# Patient Record
Sex: Female | Born: 1995
Health system: Southern US, Community
[De-identification: ages and names within clinical notes are randomized; demographics above are authoritative.]

## PROBLEM LIST (undated history)

## (undated) DIAGNOSIS — R600 Localized edema: Secondary | ICD-10-CM

## (undated) DIAGNOSIS — F329 Major depressive disorder, single episode, unspecified: Secondary | ICD-10-CM

## (undated) DIAGNOSIS — E282 Polycystic ovarian syndrome: Secondary | ICD-10-CM

## (undated) DIAGNOSIS — R7303 Prediabetes: Secondary | ICD-10-CM

## (undated) DIAGNOSIS — K760 Fatty (change of) liver, not elsewhere classified: Secondary | ICD-10-CM

## (undated) DIAGNOSIS — K219 Gastro-esophageal reflux disease without esophagitis: Secondary | ICD-10-CM

## (undated) DIAGNOSIS — G4733 Obstructive sleep apnea (adult) (pediatric): Secondary | ICD-10-CM

## (undated) DIAGNOSIS — G8929 Other chronic pain: Secondary | ICD-10-CM

## (undated) DIAGNOSIS — M549 Dorsalgia, unspecified: Secondary | ICD-10-CM

## (undated) DIAGNOSIS — K5900 Constipation, unspecified: Secondary | ICD-10-CM

## (undated) DIAGNOSIS — E538 Deficiency of other specified B group vitamins: Secondary | ICD-10-CM

## (undated) DIAGNOSIS — M2550 Pain in unspecified joint: Secondary | ICD-10-CM

## (undated) DIAGNOSIS — E7800 Pure hypercholesterolemia, unspecified: Secondary | ICD-10-CM

## (undated) DIAGNOSIS — R002 Palpitations: Secondary | ICD-10-CM

## (undated) DIAGNOSIS — F411 Generalized anxiety disorder: Secondary | ICD-10-CM

## (undated) DIAGNOSIS — F419 Anxiety disorder, unspecified: Secondary | ICD-10-CM

## (undated) DIAGNOSIS — F32A Depression, unspecified: Secondary | ICD-10-CM

## (undated) HISTORY — DX: Constipation, unspecified: K59.00

## (undated) HISTORY — DX: Gastro-esophageal reflux disease without esophagitis: K21.9

## (undated) HISTORY — DX: Deficiency of other specified B group vitamins: E53.8

## (undated) HISTORY — DX: Pure hypercholesterolemia, unspecified: E78.00

## (undated) HISTORY — DX: Fatty (change of) liver, not elsewhere classified: K76.0

## (undated) HISTORY — DX: Localized edema: R60.0

## (undated) HISTORY — DX: Prediabetes: R73.03

## (undated) HISTORY — DX: Dorsalgia, unspecified: M54.9

## (undated) HISTORY — DX: Generalized anxiety disorder: F41.1

## (undated) HISTORY — DX: Pain in unspecified joint: M25.50

## (undated) HISTORY — DX: Obstructive sleep apnea (adult) (pediatric): G47.33

## (undated) HISTORY — DX: Palpitations: R00.2

---
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
## 2016-09-04 ENCOUNTER — Encounter (HOSPITAL_BASED_OUTPATIENT_CLINIC_OR_DEPARTMENT_OTHER): Payer: Self-pay | Admitting: *Deleted

## 2016-09-04 DIAGNOSIS — F17210 Nicotine dependence, cigarettes, uncomplicated: Secondary | ICD-10-CM | POA: Insufficient documentation

## 2016-09-04 DIAGNOSIS — R51 Headache: Secondary | ICD-10-CM | POA: Insufficient documentation

## 2016-09-04 DIAGNOSIS — H5712 Ocular pain, left eye: Secondary | ICD-10-CM | POA: Insufficient documentation

## 2016-09-04 MED ORDER — FLUORESCEIN SODIUM 0.6 MG OP STRP
1.0000 | ORAL_STRIP | Freq: Once | OPHTHALMIC | Status: AC
  Administered 2016-09-05: 1 via OPHTHALMIC
  Filled 2016-09-04: qty 1

## 2016-09-04 MED ORDER — TETRACAINE HCL 0.5 % OP SOLN
1.0000 [drp] | Freq: Once | OPHTHALMIC | Status: AC
  Administered 2016-09-05: 1 [drp] via OPHTHALMIC
  Filled 2016-09-04: qty 4

## 2016-09-04 NOTE — ED Triage Notes (Signed)
Pt c/o left eye redness ? Foreign body x 1 hr

## 2016-09-05 ENCOUNTER — Emergency Department (HOSPITAL_BASED_OUTPATIENT_CLINIC_OR_DEPARTMENT_OTHER)
Admission: EM | Admit: 2016-09-05 | Discharge: 2016-09-05 | Disposition: A | Discharge status: Home / Self Care | Payer: Self-pay | Attending: Emergency Medicine | Admitting: Emergency Medicine

## 2016-09-05 DIAGNOSIS — H5712 Ocular pain, left eye: Secondary | ICD-10-CM

## 2016-09-05 MED ORDER — IBUPROFEN 400 MG PO TABS
400.0000 mg | ORAL_TABLET | Freq: Once | ORAL | Status: AC
  Administered 2016-09-05: 400 mg via ORAL
  Filled 2016-09-05: qty 1

## 2016-09-05 MED ORDER — OXYCODONE-ACETAMINOPHEN 5-325 MG PO TABS: 1.0000 | ORAL_TABLET | Freq: Once | ORAL | Status: DC

## 2016-09-05 MED ORDER — IBUPROFEN 400 MG PO TABS
ORAL_TABLET | ORAL | Status: AC
  Filled 2016-09-05: qty 1

## 2016-09-05 MED ORDER — OXYCODONE-ACETAMINOPHEN 5-325 MG PO TABS
ORAL_TABLET | ORAL | Status: AC
  Filled 2016-09-05: qty 1

## 2016-09-05 NOTE — Discharge Instructions (Signed)
You were seen in the emergency department for left eye pain. The pH of your eye was normal, there was no scratches or ulcers, no changes in your visual acuity and normal pressure. You may alternate Tylenol 1000 g every 6 hours as needed for pain and ibuprofen 800 mg every 8 hours as needed for pain. Your eyes did seem very dry. You may use over-the-counter eyedrops you help with lubrication. Given your symptoms have improved, I feel you're safe to be discharged home. He may follow-up with an ophthalmologist if symptoms are not improving or worsen. If you ever have fever, loss of vision, drainage of pus appearing material from your eye, severe headache with vomiting with eye pain, please return to the hospital.

## 2016-09-05 NOTE — ED Notes (Signed)
EDP into room 

## 2016-09-05 NOTE — ED Provider Notes (Signed)
By signing my name below, I, Vista Mink, attest that this documentation has been prepared under the direction and in the presence of Ericson Nafziger N Daylan Juhnke, DO. Electronically signed, Vista Mink, ED Scribe. 09/05/16. 2:25 AM.  TIME SEEN: 2:17 AM  CHIEF COMPLAINT: Left eye pain and redness  HPI:  HPI Comments: Molly Ray is a 21 y.o. female who presents to the Emergency Department complaining of left eye pain and redness after an incident that occurred at approximately 2130 yesterday, 6 hours ago. Pt was in a rush and was trying to put on Mitchum gel deodorant but reports it accidentally flew into her left eye. She does report a mild headache since this occurred but no nausea or vomiting. No change in vision currently. Did have some blurry vision that has resolved. No vision loss.. No other injuries to eye or face. No eye surgeries. She does not wear contacts or glasses. She does not have a current Opthalmologist. She does not use eye drops regularly.    ROS: See HPI Constitutional: no fever  Eyes: no drainage  ENT: no runny nose   Cardiovascular:  no chest pain  Resp: no SOB  GI: no vomiting GU: no dysuria Integumentary: no rash  Allergy: no hives  Musculoskeletal: no leg swelling  Neurological: no slurred speech ROS otherwise negative  PAST MEDICAL HISTORY/PAST SURGICAL HISTORY:  History reviewed. No pertinent past medical history.  MEDICATIONS:  Prior to Admission medications   Not on File    ALLERGIES:  No Known Allergies  SOCIAL HISTORY:  Social History  Substance Use Topics  . Smoking status: Current Every Day Smoker    Packs/day: 1.00    Types: Cigarettes  . Smokeless tobacco: Not on file  . Alcohol use No    FAMILY HISTORY: No family history on file.  EXAM: BP 129/89   Pulse 96   Temp 98 F (36.7 C)   Resp 18   Ht 5\' 4"  (1.626 m)   Wt 210 lb (95.3 kg)   SpO2 100%   BMI 36.05 kg/m  CONSTITUTIONAL: Alert and oriented and responds appropriately to  questions. Well-appearing; well-nourished HEAD: Normocephalic, Atraumatic EYES: Conjunctivae clear, no discharge, PERRL, EOMI. Normal visual fields and visual acuity. IOP is in left eye. No corneal abrasion or ulceration to left eye. The pH of the left eye is 7.0 - 7.5.  No sign of any burns to the cornea. No obvious foreign body. ENT: normal nose; no rhinorrhea; moist mucous membranes, no facial cellulitis or swelling NECK: Supple, no meningismus, no nuchal rigidity, no LAD  CARD: RRR; S1 and S2 appreciated; no murmurs, no clicks, no rubs, no gallops RESP: Normal chest excursion without splinting or tachypnea; breath sounds clear and equal bilaterally; no wheezes, no rhonchi, no rales, no hypoxia or respiratory distress, speaking full sentences ABD/GI: Normal bowel sounds; non-distended; soft, non-tender BACK:  The back appears normal EXT: Normal ROM in all joints    SKIN: Normal color for age and race; warm; no rash NEURO: Moves all extremities equally PSYCH: The patient's mood and manner are appropriate. Grooming and personal hygiene are appropriate.  MEDICAL DECISION MAKING: Patient here with left eye pain and blurry vision that has improved after getting deodorant into her eye. No corneal abrasions or ulceration. Globe is intact. Normal visual fields and visual acuity. Extraocular movements are normal. Normal pressure of the left eye. Normal pH. No sign of any burns. No sign of facial cellulitis. I do not feel her eye needs  to be irrigated at this time. I do not feel she needs antibiotic ointment or drops. She reports feeling better. Have given her outpatient ophthalmology follow-up if symptoms are not improving.  Suspect that this was a mild irritant in symptoms are improving with time. She has been using over-the-counter eye lubrication which has been helping her symptoms. Discussed with her that she could continue this.  At this time, I do not feel there is any life-threatening  condition present. I have reviewed and discussed all results (EKG, imaging, lab, urine as appropriate) and exam findings with patient/family. I have reviewed nursing notes and appropriate previous records.  I feel the patient is safe to be discharged home without further emergent workup and can continue workup as an outpatient as needed. Discussed usual and customary return precautions. Patient/family verbalize understanding and are comfortable with this plan.  Outpatient follow-up has been provided. All questions have been answered.   I personally performed the services described in this documentation, which was scribed in my presence. The recorded information has been reviewed and is accurate.     Layla MawKristen N Shanen Norris, DO 09/05/16 [phone]

## 2018-01-04 ENCOUNTER — Encounter (HOSPITAL_BASED_OUTPATIENT_CLINIC_OR_DEPARTMENT_OTHER): Payer: Self-pay | Admitting: Adult Health

## 2018-01-04 ENCOUNTER — Other Ambulatory Visit: Payer: Self-pay

## 2018-01-04 ENCOUNTER — Emergency Department (HOSPITAL_BASED_OUTPATIENT_CLINIC_OR_DEPARTMENT_OTHER)
Admission: EM | Admit: 2018-01-04 | Discharge: 2018-01-04 | Disposition: A | Discharge status: Home / Self Care | Payer: Self-pay | Attending: Emergency Medicine | Admitting: Emergency Medicine

## 2018-01-04 DIAGNOSIS — G5603 Carpal tunnel syndrome, bilateral upper limbs: Secondary | ICD-10-CM | POA: Insufficient documentation

## 2018-01-04 MED ORDER — DICLOFENAC SODIUM 1 % TD GEL: 2.0000 g | Freq: Four times a day (QID) | TRANSDERMAL | 0 refills | Status: DC

## 2018-01-04 NOTE — ED Triage Notes (Addendum)
Presents with one year of bilateral hand numbness, tingling and grip loss. Pt is a furniture sander for a living.

## 2018-01-04 NOTE — ED Provider Notes (Signed)
[facility] EMERGENCY DEPARTMENT Provider Note   CSN: [id number] Arrival date & time: 01/04/18  1310     History   Chief Complaint Chief Complaint  Patient presents with  . Hand Pain    HPI Molly Ray is a 22 y.o. female with no pertinent past medical history presenting with 1 year of bilateral hand pain and numbness. She has not seen anyone for this in the past until very recently and was prescribed 800 ibuprofen and muscle relaxant. She explains that they though it might be carpal tunnel but didn't officially diagnose her. She has not been wearing wrist guards. She has a repetitive occupation and sands furniture daily. Her pain is aggravated by repetitive motion and at night, alleviated by rest.  She is experiencing numbness/tingling in 1-3 rd fingers bilaterally which also feels like it spreads to the whole hand from there. Symptoms are worse in the dominant hand where she is also complaining of "knots in the right forearm".   HPI  History reviewed. No pertinent past medical history.  There are no active problems to display for this patient.   History reviewed. No pertinent surgical history.   OB History   None      Home Medications    Prior to Admission medications   Medication Sig Start Date End Date Taking? Authorizing Provider  diclofenac sodium (VOLTAREN) 1 % GEL Apply 2 g topically 4 (four) times daily. 01/04/18   Georgiana Shore PA-C    Family History History reviewed. No pertinent family history.  Social History Social History   Tobacco Use  . Smoking status: Never Smoker  Substance Use Topics  . Alcohol use: No  . Drug use: No     Allergies   Patient has no known allergies.   Review of Systems Review of Systems  Constitutional: Negative for chills, diaphoresis, fatigue and fever.  Musculoskeletal: Positive for arthralgias.  Skin: Negative for color change, pallor and rash.  Neurological: Positive for numbness.      Physical Exam Updated Vital Signs BP (!) 141/59 (BP Location: Right Arm)   Pulse 91   Temp 98.5 F (36.9 C) (Oral)   Resp 18   SpO2 100%   Physical Exam  Constitutional: She appears well-developed and well-nourished. No distress.  Afebrile, nontoxic-appearing, sitting comfortably in bed no acute distress.  HENT:  Head: Normocephalic and atraumatic.  Neck: Normal range of motion.  Cardiovascular: Normal rate.  No murmur heard. Pulmonary/Chest: Effort normal. No respiratory distress.  Musculoskeletal: Normal range of motion. She exhibits no edema, tenderness or deformity.  Positive Phalen's bilaterally and positive Tinel sign worse on the right  Neurological: She is alert. No sensory deficit.  Strong grips bilaterally  Skin: Skin is warm and dry. No rash noted. She is not diaphoretic. No erythema. No pallor.  Psychiatric: She has a normal mood and affect.  Nursing note and vitals reviewed.    ED Treatments / Results  Labs (all labs ordered are listed, but only abnormal results are displayed) Labs Reviewed - No data to display  EKG None  Radiology No results found.  Procedures Procedures (including critical care time) SPLINT APPLICATION Date/Time: 3:24 PM Authorized by: Georgiana Shore Consent: Verbal consent obtained. Risks and benefits: risks, benefits and alternatives were discussed Consent given by: patient Splint applied by: orthopedic technician Location details: bilateral wrist Splint type: wrist Supplies used: wrist Post-procedure: The splinted body part was neurovascularly unchanged following the procedure. Patient tolerance: Patient tolerated the procedure  well with no immediate complications.    Medications Ordered in ED Medications - No data to display   Initial Impression / Assessment and Plan / ED Course  I have reviewed the triage vital signs and the nursing notes.  Pertinent labs & imaging results that were available during my  care of the patient were reviewed by me and considered in my medical decision making (see chart for details).    Presenting with bilateral hand pain and tingling over the past year.  She has a repetitive occupation sanding furniture daily and reports worsening with this activity or at nighttime.  Has not seen anyone for her symptoms until recently when she was prescribed ibuprofen and muscle relaxant with modest relief.    She has not been wearing wrist guards.   On exam, she has strong grips and positive Phalen's and Tinel sign which is worse in her dominant hand.  Full range of motion.  No edema, erythema or warmth.  She is otherwise well-appearing nontoxic afebrile.  Will discharge home with symptomatic relief and advised patient to wear wrist guards at work and at nighttime.  Discussed the possibility that if her symptoms do not improve she may require surgical release.  Urged patient to follow back up with her primary care provider in a week.  Discussed return precautions and patient understands and agrees with plan.  Final Clinical Impressions(s) / ED Diagnoses   Final diagnoses:  Bilateral carpal tunnel syndrome    ED Discharge Orders        Ordered    diclofenac sodium (VOLTAREN) 1 % GEL  4 times daily     01/04/18 1509       Gregary Cromer 01/04/18 1526    Tilden Fossa, MD 01/05/18 [phone]

## 2018-01-04 NOTE — Discharge Instructions (Addendum)
As discussed, make sure that you wear your wrist guards while doing any repetitive movements as well as at nighttime.  Continue with your ibuprofen and muscle relaxant as recently prescribed. Follow-up with your primary care provider in a week.

## 2018-01-16 ENCOUNTER — Other Ambulatory Visit: Payer: Self-pay

## 2018-01-16 ENCOUNTER — Encounter (HOSPITAL_BASED_OUTPATIENT_CLINIC_OR_DEPARTMENT_OTHER): Payer: Self-pay | Admitting: Emergency Medicine

## 2018-01-16 ENCOUNTER — Emergency Department (HOSPITAL_BASED_OUTPATIENT_CLINIC_OR_DEPARTMENT_OTHER)
Admission: EM | Admit: 2018-01-16 | Discharge: 2018-01-16 | Disposition: A | Discharge status: Home / Self Care | Payer: Medicaid Other | Attending: Emergency Medicine | Admitting: Emergency Medicine

## 2018-01-16 DIAGNOSIS — Z79899 Other long term (current) drug therapy: Secondary | ICD-10-CM | POA: Insufficient documentation

## 2018-01-16 DIAGNOSIS — W228XXA Striking against or struck by other objects, initial encounter: Secondary | ICD-10-CM | POA: Insufficient documentation

## 2018-01-16 DIAGNOSIS — L089 Local infection of the skin and subcutaneous tissue, unspecified: Secondary | ICD-10-CM | POA: Insufficient documentation

## 2018-01-16 MED ORDER — CEPHALEXIN 500 MG PO CAPS: 500.0000 mg | ORAL_CAPSULE | Freq: Four times a day (QID) | ORAL | 0 refills | Status: DC

## 2018-01-16 MED FILL — CEPHALEXIN 500 MG CAPSULE: 500 | 10 days supply | Qty: 40 | Fill #0

## 2018-01-16 NOTE — Discharge Instructions (Signed)
It was my pleasure taking care of you today!   Warm soaks to the foot at least twice daily.  Please take all of your antibiotics until finished!   Return to ER for new or worsening symptoms, any additional concerns.

## 2018-01-16 NOTE — ED Triage Notes (Signed)
Pt c/o pain to LT grt toe; sts she thinks she stubbed it over the weekend

## 2018-01-16 NOTE — ED Provider Notes (Signed)
[facility] EMERGENCY DEPARTMENT Provider Note   CSN: [id number] Arrival date & time: 01/16/18  [id number]     History   Chief Complaint Chief Complaint  Patient presents with  . Toe Pain    HPI Haze RushingBrianna N Modesto is a 22 y.o. female.  The history is provided by the patient and medical records. No language interpreter was used.  Toe Pain    Haze RushingBrianna N Schmeling is a 22 y.o. female an otherwise healthy 22 year old female who presents to the emergency department complaining of hitting her left great toe about 4 days ago.  Pain described as throbbing.  2 days ago, she noticed an increase in her pain and some white drainage to the medial nailbed area.  Over the last 2 days, pain has intensified and drainage has been ongoing.  No medications taken prior to arrival for her symptoms.  Pain worse with ambulation and better with rest, however still does feel like a throbbing at rest.  Denies any surrounding redness.  No fever or chills.  No numbness or tingling.  History reviewed. No pertinent past medical history.  There are no active problems to display for this patient.   History reviewed. No pertinent surgical history.   OB History   None      Home Medications    Prior to Admission medications   Medication Sig Start Date End Date Taking? Authorizing Provider  CITALOPRAM HYDROBROMIDE PO Take by mouth.   Yes [provider]  CYCLOBENZAPRINE HCL PO Take by mouth.   Yes [provider]  HYDROXYZINE HCL PO Take by mouth.   Yes [provider]  ibuprofen (ADVIL,MOTRIN) 800 MG tablet Take 800 mg by mouth every 8 (eight) hours as needed.   Yes [provider]  cephALEXin (KEFLEX) 500 MG capsule Take 1 capsule (500 mg total) by mouth 4 (four) times daily. 01/16/18   Jordis Repetto, Chase PicketJaime Pilcher, PA-C  diclofenac sodium (VOLTAREN) 1 % GEL Apply 2 g topically 4 (four) times daily. 01/04/18   Georgiana ShoreMitchell, Jessica B, PA-C    Family History No family history on  file.  Social History Social History   Tobacco Use  . Smoking status: Never Smoker  . Smokeless tobacco: Never Used  Substance Use Topics  . Alcohol use: No  . Drug use: No     Allergies   Patient has no known allergies.   Review of Systems Review of Systems  Constitutional: Negative for fever.  Musculoskeletal: Positive for arthralgias.  Skin: Positive for wound.  Neurological: Negative for weakness and numbness.     Physical Exam Updated Vital Signs BP 115/71 (BP Location: Right Arm)   Pulse 86   Temp 98.6 F (37 C) (Oral)   Resp 18   Ht 5\' 5"  (1.651 m)   Wt 104.3 kg (230 lb)   SpO2 100%   BMI 38.27 kg/m   Physical Exam  Constitutional: She appears well-developed and well-nourished. No distress.  HENT:  Head: Normocephalic and atraumatic.  Neck: Neck supple.  Cardiovascular: Normal rate, regular rhythm and normal heart sounds.  No murmur heard. Pulmonary/Chest: Effort normal and breath sounds normal. No respiratory distress. She has no wheezes. She has no rales.  Musculoskeletal:  Tenderness to palpation of left medial great toe nailbed.  Actively draining purulent discharge. Good cap refill. 2+ DP. No surrounding redness.  Neurological: She is alert.  Skin: Skin is warm and dry.  Nursing note and vitals reviewed.    ED Treatments / Results  Labs (all labs ordered are listed, but only abnormal results are displayed) Labs Reviewed - No data to display  EKG None  Radiology No results found.  Procedures Procedures (including critical care time)  Medications Ordered in ED Medications - No data to display   Initial Impression / Assessment and Plan / ED Course  I have reviewed the triage vital signs and the nursing notes.  Pertinent labs & imaging results that were available during my care of the patient were reviewed by me and considered in my medical decision making (see chart for details).    CATERIN TABARES is a 22 y.o. female who  presents to ED for left great toe pain. Struck the area this weekend and noticed swelling and purulent discharge from the area about 2 days ago. One exam, skin fold of the nail is actually raised and area is actively draining. I manually expressed further purulent discharge and irrigated nail. Will have her perform warm soaks and start on Keflex. Reasons to return to ER and home care instructions discussed. All questions answered.   Final Clinical Impressions(s) / ED Diagnoses   Final diagnoses:  Toe infection    ED Discharge Orders        Ordered    cephALEXin (KEFLEX) 500 MG capsule  4 times daily     01/16/18 1009       Shane Melby, Chase Picket, PA-C 01/16/18 1019    Tilden Fossa, MD 01/16/18 [phone]

## 2018-09-20 ENCOUNTER — Emergency Department (HOSPITAL_BASED_OUTPATIENT_CLINIC_OR_DEPARTMENT_OTHER)
Admission: EM | Admit: 2018-09-20 | Discharge: 2018-09-20 | Disposition: A | Discharge status: Home / Self Care | Payer: Medicaid Other | Attending: Emergency Medicine | Admitting: Emergency Medicine

## 2018-09-20 ENCOUNTER — Encounter (HOSPITAL_BASED_OUTPATIENT_CLINIC_OR_DEPARTMENT_OTHER): Payer: Self-pay | Admitting: *Deleted

## 2018-09-20 ENCOUNTER — Emergency Department (HOSPITAL_BASED_OUTPATIENT_CLINIC_OR_DEPARTMENT_OTHER): Payer: Medicaid Other

## 2018-09-20 ENCOUNTER — Other Ambulatory Visit: Payer: Self-pay

## 2018-09-20 DIAGNOSIS — M25562 Pain in left knee: Secondary | ICD-10-CM | POA: Insufficient documentation

## 2018-09-20 DIAGNOSIS — F17210 Nicotine dependence, cigarettes, uncomplicated: Secondary | ICD-10-CM | POA: Insufficient documentation

## 2018-09-20 DIAGNOSIS — Z79899 Other long term (current) drug therapy: Secondary | ICD-10-CM | POA: Insufficient documentation

## 2018-09-20 HISTORY — DX: Major depressive disorder, single episode, unspecified: F32.9

## 2018-09-20 HISTORY — DX: Depression, unspecified: F32.A

## 2018-09-20 HISTORY — DX: Anxiety disorder, unspecified: F41.9

## 2018-09-20 HISTORY — DX: Other chronic pain: G89.29

## 2018-09-20 NOTE — ED Provider Notes (Signed)
[facility] EMERGENCY DEPARTMENT Provider Note   CSN: [id number] Arrival date & time: 09/20/18  1729     History  [id number] Chief Complaint Chief Complaint  Patient presents with  . Knee Pain    HPI Molly Ray is a 23 y.o. female.  23 y/o female with a PMH of Anxiety, depression presents to the ED with a chief complaint of left knee pain x 1 month. Patient reports recent activity as she currently has a job and does a lot of walking.  She reports swelling to her left knee that is worse at the end of the workday.  He states this morning she felt the pain was unbearable as she could barely bend her knee.  The pain is worse with walking and bending.  Been taking ibuprofen occasionally for relieving symptoms.  Denies any fever, trauma, weakness to left leg or other extremities.     Past Medical History:  Diagnosis Date  . Anxiety   . Chronic pain   . Depression     There are no active problems to display for this patient.   History reviewed. No pertinent surgical history.   OB History   No obstetric history on file.      Home Medications    Prior to Admission medications   Medication Sig Start Date End Date Taking? Authorizing Provider  busPIRone HCl (BUSPAR PO) Take by mouth.   Yes [provider]  CITALOPRAM HYDROBROMIDE PO Take by mouth.   Yes [provider]  CYCLOBENZAPRINE HCL PO Take by mouth.   Yes [provider]  diclofenac sodium (VOLTAREN) 1 % GEL Apply 2 g topically 4 (four) times daily. 01/04/18  Yes Mathews RobinsonsMitchell, Jessica B, PA-C  ibuprofen (ADVIL,MOTRIN) 800 MG tablet Take 800 mg by mouth every 8 (eight) hours as needed.   Yes [provider]  cephALEXin (KEFLEX) 500 MG capsule Take 1 capsule (500 mg total) by mouth 4 (four) times daily. 01/16/18   Ward, Chase PicketJaime Pilcher, PA-C  HYDROXYZINE HCL PO Take by mouth.    [provider]    Family History No family history on file.  Social History Social History    Tobacco Use  . Smoking status: Current Every Day Smoker    Packs/day: 1.00    Types: Cigarettes  . Smokeless tobacco: Never Used  Substance Use Topics  . Alcohol use: Not Currently  . Drug use: No     Allergies   Patient has no known allergies.   Review of Systems Review of Systems  Constitutional: Negative for fever.  Musculoskeletal: Positive for arthralgias. Negative for joint swelling.     Physical Exam Updated Vital Signs BP (!) 142/99   Pulse 97   Temp 98.7 F (37.1 C) (Oral)   Resp 20   Ht 5\' 4"  (1.626 m)   Wt (!) 136.8 kg   LMP  (LMP Unknown) Comment: about 3 yrs ago  SpO2 99%   BMI 51.77 kg/m   Physical Exam Vitals signs and nursing note reviewed.  Constitutional:      General: She is not in acute distress.    Appearance: She is well-developed.     Comments:  Well appearing in no distress sitting down.  HENT:     Head: Normocephalic and atraumatic.     Mouth/Throat:     Pharynx: No oropharyngeal exudate.  Eyes:     Pupils: Pupils are equal, round, and reactive to light.  Neck:     Musculoskeletal: Normal  range of motion.  Cardiovascular:     Rate and Rhythm: Regular rhythm.     Heart sounds: Normal heart sounds.  Pulmonary:     Effort: Pulmonary effort is normal. No respiratory distress.     Breath sounds: Normal breath sounds.  Abdominal:     General: Bowel sounds are normal. There is no distension.     Palpations: Abdomen is soft.     Tenderness: There is no abdominal tenderness.  Musculoskeletal:        General: No tenderness or deformity.     Right knee: Normal.     Left knee: She exhibits normal range of motion, no swelling, no effusion, no deformity, no laceration, no erythema, normal alignment and no LCL laxity.     Right lower leg: No edema.     Left lower leg: No edema.       Legs:  Skin:    General: Skin is warm and dry.  Neurological:     Mental Status: She is alert and oriented to person, place, and time.      ED  Treatments / Results  Labs (all labs ordered are listed, but only abnormal results are displayed) Labs Reviewed - No data to display  EKG None  Radiology Dg Knee Complete 4 Views Left  Result Date: 09/20/2018 CLINICAL DATA:  Patient with left knee pain for 1 month. EXAM: LEFT KNEE - COMPLETE 4+ VIEW COMPARISON:  None. FINDINGS: Normal anatomic alignment. No evidence for acute fracture or dislocation. No joint effusion. Regional soft tissues unremarkable. IMPRESSION: No acute osseous abnormality. Electronically Signed   By: Annia Beltrew  Davis M.D.   On: 09/20/2018 18:07    Procedures Procedures (including critical care time)  Medications Ordered in ED Medications - No data to display   Initial Impression / Assessment and Plan / ED Course  I have reviewed the triage vital signs and the nursing notes.  Pertinent labs & imaging results that were available during my care of the patient were reviewed by me and considered in my medical decision making (see chart for details).    Presents with left knee pain x1 month, reports increase in activity.  States pain below the kneecap, not improved by ibuprofen.  X-ray of left knee showed no joint effusion, no dislocation or fracture.  Denies any fever low suspicion for any septic joint.  At this time will have patient treat with Aleve over-the-counter, she is currently on SSRI so we will have her take over-the-counter dosage.  Also it still elevated will provide her with knee brace while in the ED to wear for comfort.  I have also given her referral for Dr. Norton BlizzardShane Hudnall to follow-up with him if pain persist.  He understands and agrees with management.  Return precautions provided.  Final Clinical Impressions(s) / ED Diagnoses   Final diagnoses:  Acute pain of left knee    ED Discharge Orders    None       Claude MangesSoto, Queenie Aufiero, PA-C 09/20/18 1828    Melene PlanFloyd, Dan, DO 09/20/18 2257

## 2018-09-20 NOTE — ED Triage Notes (Signed)
Left knee pain and swelling. States she started a new job that requires her to do a lot of walking.

## 2018-09-20 NOTE — ED Notes (Signed)
Pt took tylenol this AM

## 2018-09-20 NOTE — Discharge Instructions (Addendum)
Xray today was negative for any fractures or dislocations.  I have given a referral to Dr. Norton Blizzard please schedule an appointment with the orthopedist for further management of your knee pain.  May alternate ibuprofen, Tylenol or Aleve to help with your symptoms.  Apply ice or heat to the area along with elevate after work.

## 2018-10-29 ENCOUNTER — Emergency Department (HOSPITAL_BASED_OUTPATIENT_CLINIC_OR_DEPARTMENT_OTHER): Payer: PRIVATE HEALTH INSURANCE

## 2018-10-29 ENCOUNTER — Encounter (HOSPITAL_BASED_OUTPATIENT_CLINIC_OR_DEPARTMENT_OTHER): Payer: Self-pay | Admitting: Emergency Medicine

## 2018-10-29 ENCOUNTER — Emergency Department (HOSPITAL_BASED_OUTPATIENT_CLINIC_OR_DEPARTMENT_OTHER)
Admission: EM | Admit: 2018-10-29 | Discharge: 2018-10-29 | Disposition: A | Discharge status: Home / Self Care | Payer: PRIVATE HEALTH INSURANCE | Attending: Emergency Medicine | Admitting: Emergency Medicine

## 2018-10-29 ENCOUNTER — Other Ambulatory Visit: Payer: Self-pay

## 2018-10-29 DIAGNOSIS — F17210 Nicotine dependence, cigarettes, uncomplicated: Secondary | ICD-10-CM | POA: Diagnosis not present

## 2018-10-29 DIAGNOSIS — Z79899 Other long term (current) drug therapy: Secondary | ICD-10-CM | POA: Diagnosis not present

## 2018-10-29 DIAGNOSIS — J069 Acute upper respiratory infection, unspecified: Secondary | ICD-10-CM

## 2018-10-29 DIAGNOSIS — B9789 Other viral agents as the cause of diseases classified elsewhere: Secondary | ICD-10-CM

## 2018-10-29 DIAGNOSIS — R05 Cough: Secondary | ICD-10-CM | POA: Diagnosis present

## 2018-10-29 MED ORDER — IBUPROFEN 800 MG PO TABS: 800.0000 mg | ORAL_TABLET | Freq: Three times a day (TID) | ORAL | 0 refills | Status: DC | PRN

## 2018-10-29 MED ORDER — PROMETHAZINE-DM 6.25-15 MG/5ML PO SYRP: 5.0000 mL | ORAL_SOLUTION | Freq: Four times a day (QID) | ORAL | 0 refills | Status: DC | PRN

## 2018-10-29 MED ORDER — GUAIFENESIN ER 1200 MG PO TB12: 1.0000 | ORAL_TABLET | Freq: Two times a day (BID) | ORAL | 0 refills | Status: DC

## 2018-10-29 NOTE — Discharge Instructions (Addendum)
Return here as needed.  Follow-up with your primary doctor.  Increase your fluid intake and rest as much as possible. °

## 2018-10-29 NOTE — ED Triage Notes (Signed)
Reports cough for 2 days with nasal congestion.

## 2018-10-29 NOTE — ED Provider Notes (Signed)
[facility] EMERGENCY DEPARTMENT Provider Note   CSN: [id number] Arrival date & time: 10/29/18  1630    History   Chief Complaint Chief Complaint  Patient presents with  . Cough    HPI Molly Ray is a 23 y.o. female.     HPI Patient presents to the emergency department with cough and nasal discharge over the last 2 days.  The patient states that she does not feel like she is had any fevers.  She denies taking any medications prior to arrival.  The patient denies chest pain, shortness of breath, headache,blurred vision, neck pain, fever,  weakness, numbness, dizziness, anorexia, edema, abdominal pain, nausea, vomiting, diarrhea, rash, back pain, dysuria, hematemesis, bloody stool, near syncope, or syncope. Past Medical History:  Diagnosis Date  . Anxiety   . Chronic pain   . Depression     There are no active problems to display for this patient.   History reviewed. No pertinent surgical history.   OB History   No obstetric history on file.      Home Medications    Prior to Admission medications   Medication Sig Start Date End Date Taking? Authorizing Provider  busPIRone HCl (BUSPAR PO) Take by mouth.    [provider]  cephALEXin (KEFLEX) 500 MG capsule Take 1 capsule (500 mg total) by mouth 4 (four) times daily. 01/16/18   Ward, Chase Picket, PA-C  CITALOPRAM HYDROBROMIDE PO Take by mouth.    [provider]  CYCLOBENZAPRINE HCL PO Take by mouth.    [provider]  diclofenac sodium (VOLTAREN) 1 % GEL Apply 2 g topically 4 (four) times daily. 01/04/18   Mathews Robinsons B, PA-C  HYDROXYZINE HCL PO Take by mouth.    [provider]  ibuprofen (ADVIL,MOTRIN) 800 MG tablet Take 800 mg by mouth every 8 (eight) hours as needed.    [provider]    Family History History reviewed. No pertinent family history.  Social History Social History   Tobacco Use  . Smoking status: Current Every Day Smoker   Packs/day: 1.00    Types: Cigarettes  . Smokeless tobacco: Never Used  Substance Use Topics  . Alcohol use: Not Currently  . Drug use: No     Allergies   Patient has no known allergies.   Review of Systems Review of Systems  All other systems negative except as documented in the HPI. All pertinent positives and negatives as reviewed in the HPI. Physical Exam Updated Vital Signs BP 122/80 (BP Location: Left Arm)   Pulse (!) 109   Temp 98.5 F (36.9 C) (Oral)   Resp 16   Ht 5\' 4"  (1.626 m)   Wt 132.9 kg   SpO2 100%   BMI 50.29 kg/m   Physical Exam Vitals signs and nursing note reviewed.  Constitutional:      General: She is not in acute distress.    Appearance: She is well-developed.  HENT:     Head: Normocephalic and atraumatic.  Eyes:     Pupils: Pupils are equal, round, and reactive to light.  Neck:     Musculoskeletal: Normal range of motion and neck supple.  Cardiovascular:     Rate and Rhythm: Normal rate and regular rhythm.     Heart sounds: Normal heart sounds. No murmur. No friction rub. No gallop.   Pulmonary:     Effort: Pulmonary effort is normal. No respiratory distress.     Breath sounds: Normal breath sounds. No  wheezing.  Skin:    General: Skin is warm and dry.     Capillary Refill: Capillary refill takes less than 2 seconds.     Findings: No erythema or rash.  Neurological:     Mental Status: She is alert and oriented to person, place, and time.     Motor: No abnormal muscle tone.     Coordination: Coordination normal.  Psychiatric:        Behavior: Behavior normal.      ED Treatments / Results  Labs (all labs ordered are listed, but only abnormal results are displayed) Labs Reviewed - No data to display  EKG None  Radiology Dg Chest 2 View  Result Date: 10/29/2018 CLINICAL DATA:  Cough, fever, and nasal drainage since yesterday, smoker EXAM: CHEST - 2 VIEW COMPARISON:  None FINDINGS: Normal heart size, mediastinal contours, and  pulmonary vascularity. Lungs clear. No pleural effusion or pneumothorax. Bones unremarkable. IMPRESSION: Normal exam. Electronically Signed   By: Ulyses Southward M.D.   On: 10/29/2018 18:42    Procedures Procedures (including critical care time)  Medications Ordered in ED Medications - No data to display   Initial Impression / Assessment and Plan / ED Course  I have reviewed the triage vital signs and the nursing notes.  Pertinent labs & imaging results that were available during my care of the patient were reviewed by me and considered in my medical decision making (see chart for details).       I feel that the fact that the patient does not have any respiratory distress normal vital signs and no further testing needs to be performed.  She has a negative chest x-ray.  I advised her to rest and increase her fluid intake.  Patient agrees the plan and all questions were answered.  Final Clinical Impressions(s) / ED Diagnoses   Final diagnoses:  None    ED Discharge Orders    None       Kyra Manges 10/29/18 2039    Maia Plan, MD 10/29/18 2100

## 2018-10-29 NOTE — ED Notes (Signed)
Updated pt to the delay. Gave pt gown to get ready for xray, denies any further needs.

## 2018-10-29 NOTE — ED Notes (Signed)
Cough and nasal drainage since yesterday, no fevers, no known exposure. Pt smokes and has seasonal allergies.

## 2019-09-26 IMAGING — CR DG KNEE COMPLETE 4+V*L*
4 series · 4 of 4 positions shown · non-contrast
Comparison: None.

CLINICAL DATA: Patient with left knee pain for 1 month.

EXAM:
LEFT KNEE - COMPLETE 4+ VIEW

[t knee ap left]
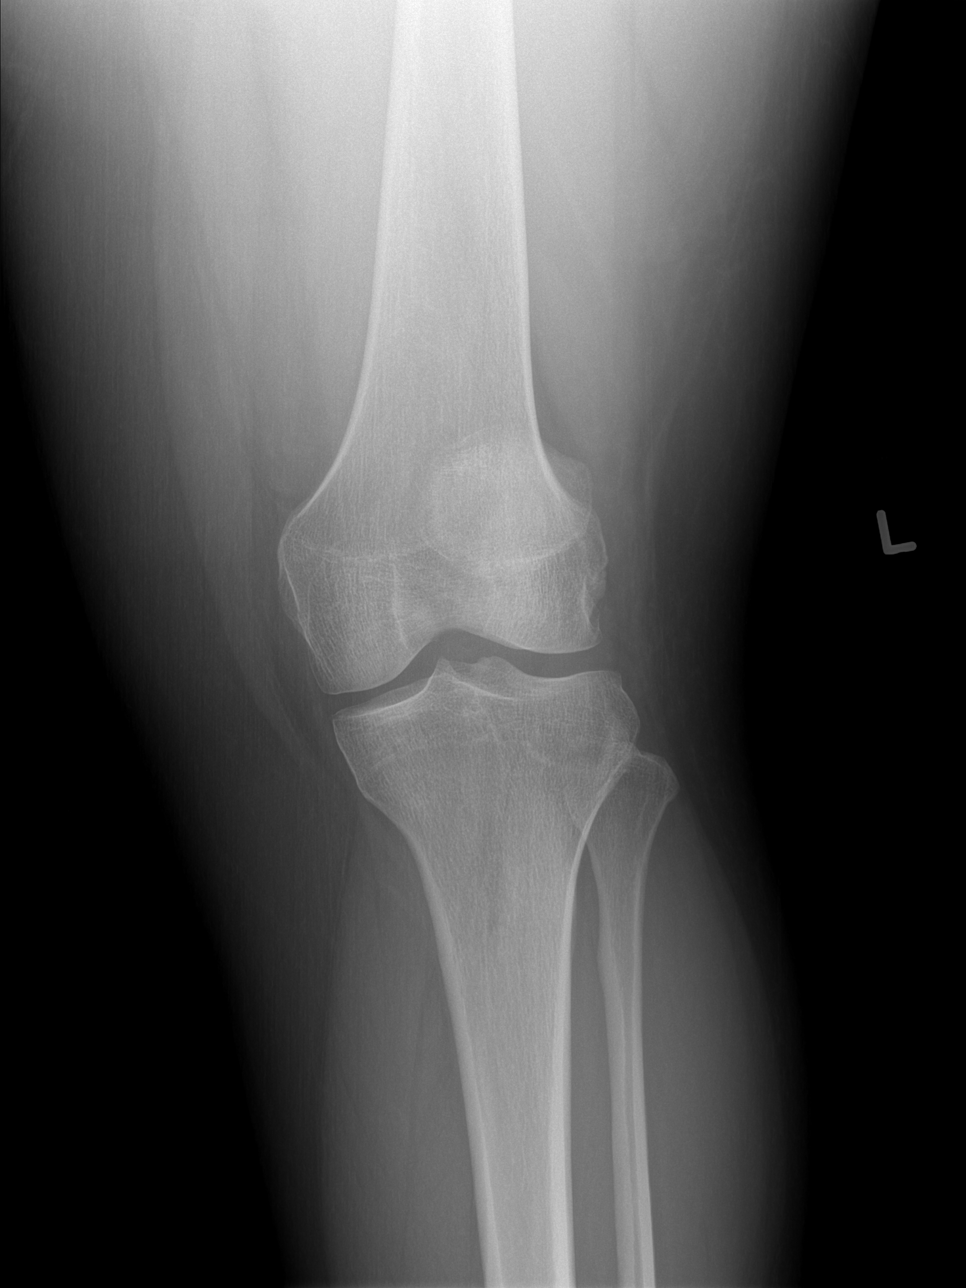

[t knee oblique left (1 of 2)]
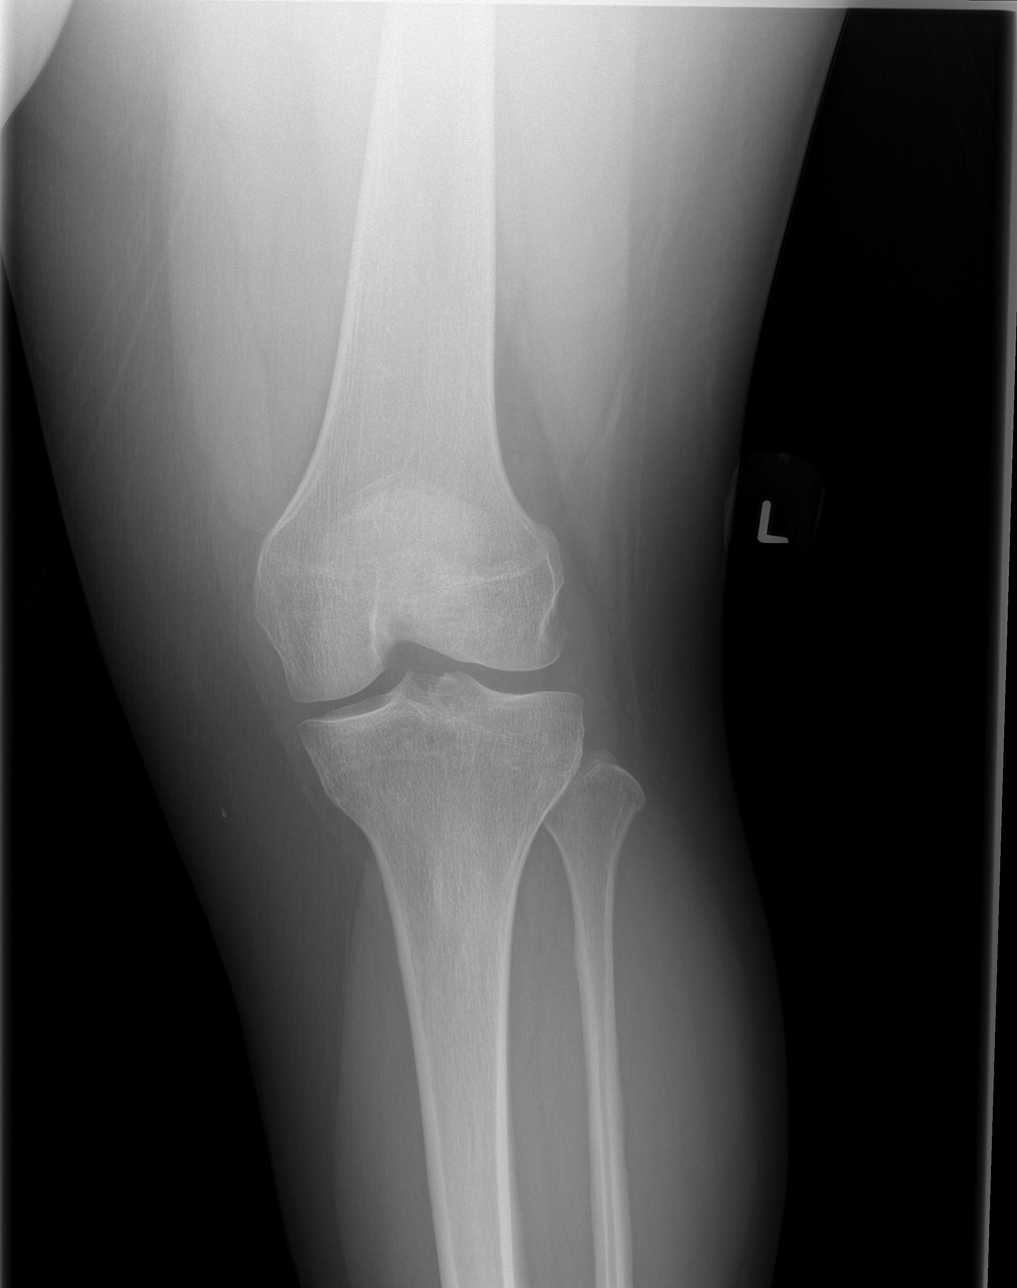

[t knee oblique left (2 of 2)]
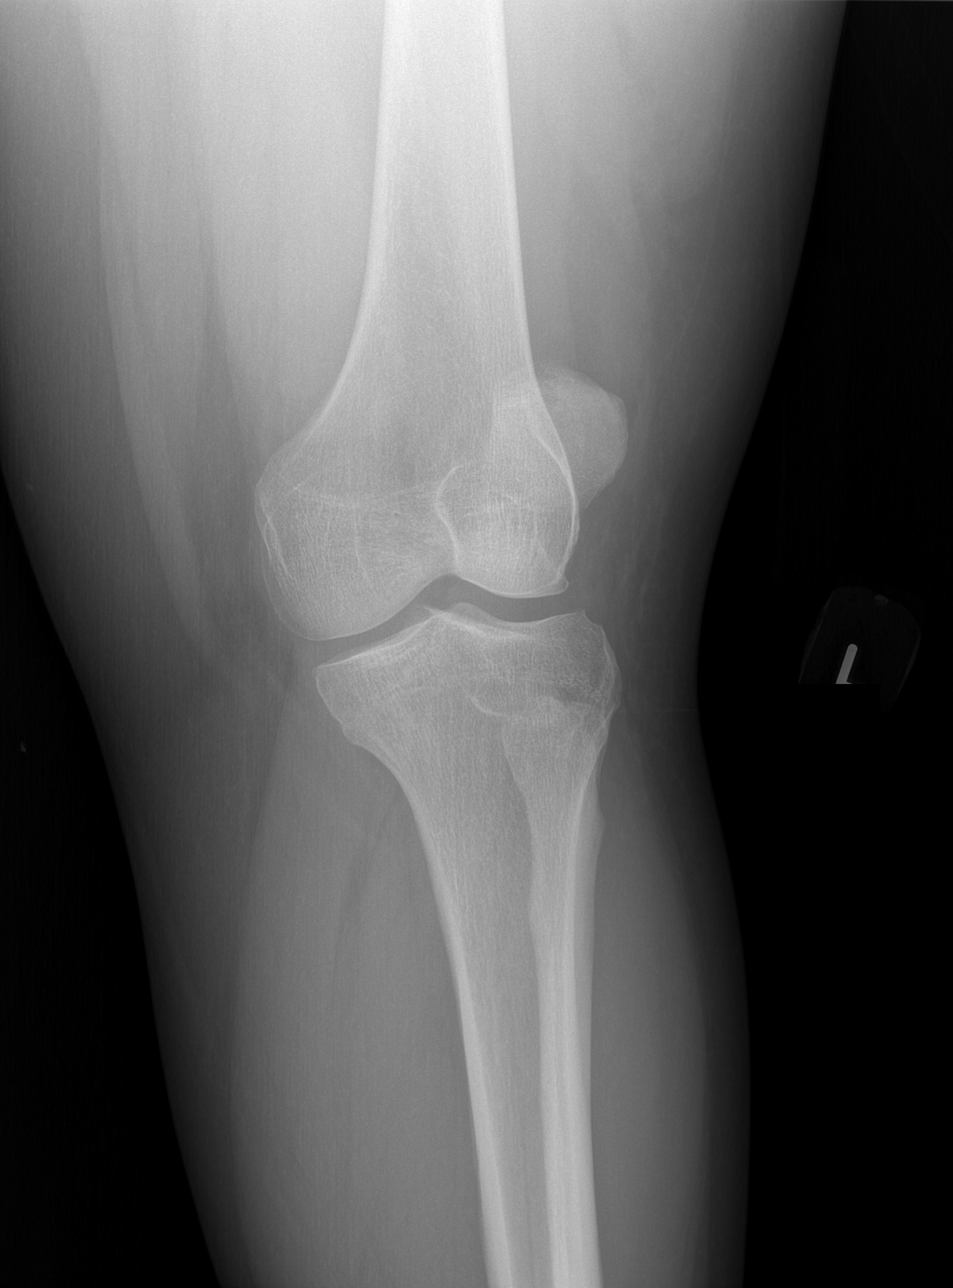

[t knee lat left]
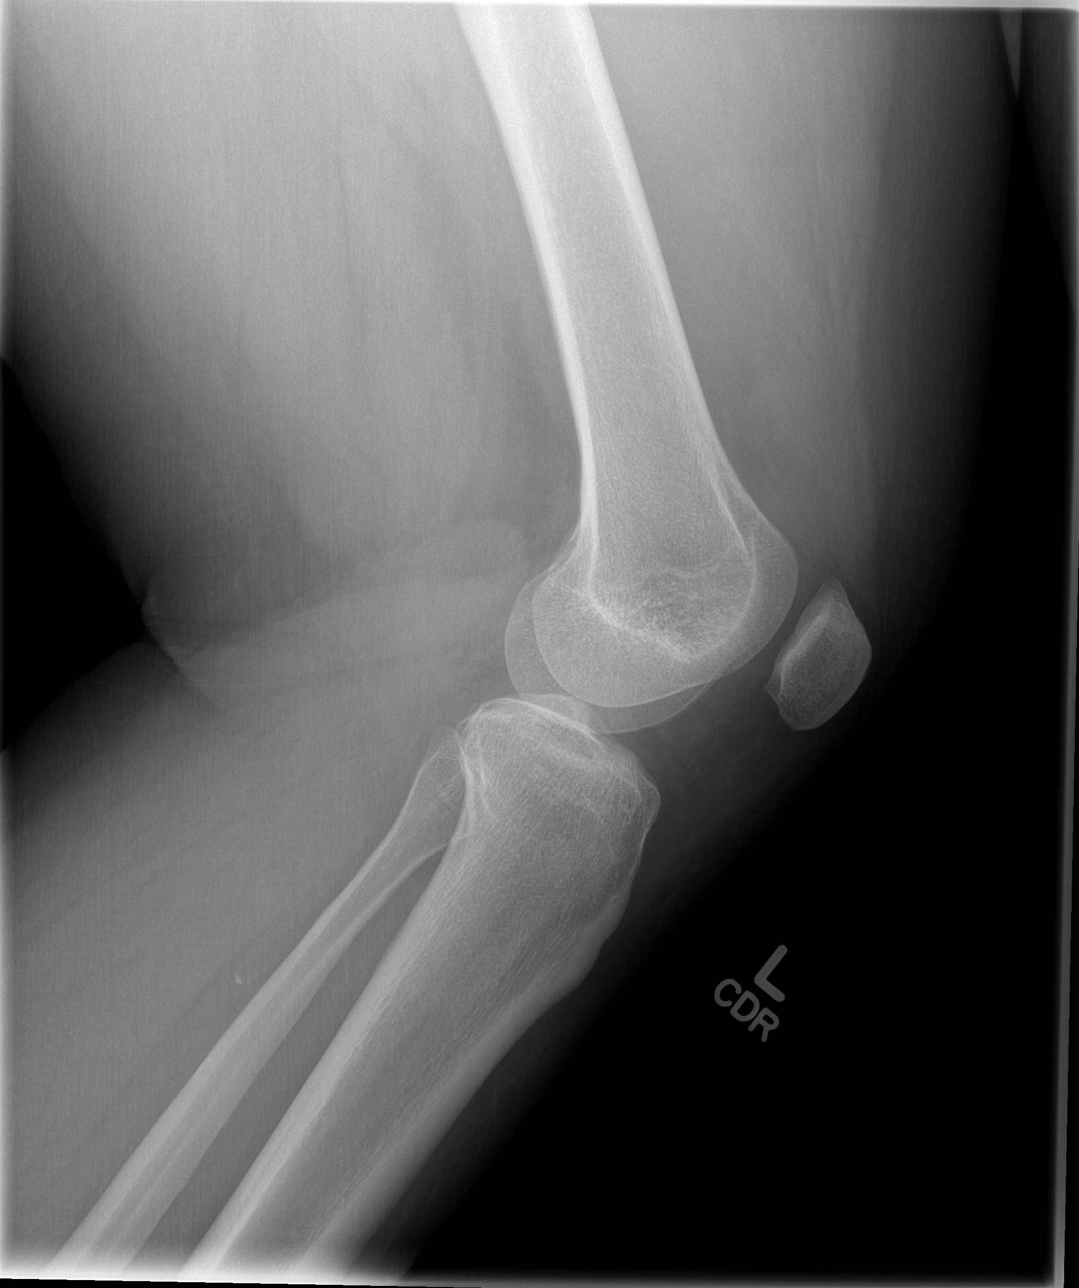

[4 of 4 positions shown; findings below may reference images not displayed]

FINDINGS: Normal anatomic alignment. No evidence for acute fracture or
dislocation. No joint effusion. Regional soft tissues unremarkable.
IMPRESSION: No acute osseous abnormality.

## 2022-01-13 ENCOUNTER — Emergency Department (HOSPITAL_BASED_OUTPATIENT_CLINIC_OR_DEPARTMENT_OTHER)
Admission: EM | Admit: 2022-01-13 | Discharge: 2022-01-13 | Disposition: A | Discharge status: Home / Self Care | Payer: BC Managed Care – PPO | Attending: Emergency Medicine | Admitting: Emergency Medicine

## 2022-01-13 ENCOUNTER — Encounter (HOSPITAL_BASED_OUTPATIENT_CLINIC_OR_DEPARTMENT_OTHER): Payer: Self-pay

## 2022-01-13 ENCOUNTER — Other Ambulatory Visit: Payer: Self-pay

## 2022-01-13 DIAGNOSIS — N760 Acute vaginitis: Secondary | ICD-10-CM | POA: Insufficient documentation

## 2022-01-13 DIAGNOSIS — N898 Other specified noninflammatory disorders of vagina: Secondary | ICD-10-CM | POA: Diagnosis present

## 2022-01-13 HISTORY — DX: Polycystic ovarian syndrome: E28.2

## 2022-01-13 LAB — URINALYSIS, ROUTINE W REFLEX MICROSCOPIC
Bilirubin Urine: NEGATIVE
Glucose, UA: NEGATIVE mg/dL
Ketones, ur: NEGATIVE mg/dL
Nitrite: NEGATIVE
Protein, ur: NEGATIVE mg/dL
Specific Gravity, Urine: <=1.005 (ref 1.005–1.030)
pH: 6.5 (ref 5.0–8.0)

## 2022-01-13 LAB — WET PREP, GENITAL
Clue Cells Wet Prep HPF POC: NONE SEEN
Sperm: NONE SEEN
Trich, Wet Prep: NONE SEEN
WBC, Wet Prep HPF POC: >=10 — AB (ref <10)
Yeast Wet Prep HPF POC: NONE SEEN

## 2022-01-13 LAB — URINALYSIS, MICROSCOPIC (REFLEX)

## 2022-01-13 LAB — PREGNANCY, URINE: Preg Test, Ur: NEGATIVE

## 2022-01-13 MED ORDER — FLUCONAZOLE 150 MG PO TABS: 150.0000 mg | ORAL_TABLET | Freq: Once | ORAL | 0 refills | Status: AC

## 2022-01-13 MED ORDER — FLUCONAZOLE 150 MG PO TABS
150.0000 mg | ORAL_TABLET | Freq: Once | ORAL | Status: AC
  Administered 2022-01-13: 150 mg via ORAL
  Filled 2022-01-13: qty 1

## 2022-01-13 NOTE — ED Provider Notes (Signed)
[facility] EMERGENCY DEPARTMENT  Provider Note  CSN: [id number] Arrival date & time: 01/13/22 0211  History Chief Complaint  Patient presents with   Vaginal Itching    Molly Ray is a 26 y.o. female reports 2 days of thick white vaginal discharge with itching. She thought it was a yeast infection so she used an OTC yeast treatment without improvement. She would also like to be checked for STI. No fever or abdominal pain.    Home Medications Prior to Admission medications   Medication Sig Start Date End Date Taking? Authorizing Provider  fluconazole (DIFLUCAN) 150 MG tablet Take 1 tablet (150 mg total) by mouth once for 1 dose. 01/16/22 01/16/22 Yes Pollyann Savoy, MD  busPIRone HCl (BUSPAR PO) Take by mouth.    [provider]  cephALEXin (KEFLEX) 500 MG capsule Take 1 capsule (500 mg total) by mouth 4 (four) times daily. 01/16/18   Ward, Chase Picket, PA-C  CITALOPRAM HYDROBROMIDE PO Take by mouth.    [provider]  CYCLOBENZAPRINE HCL PO Take by mouth.    [provider]  diclofenac sodium (VOLTAREN) 1 % GEL Apply 2 g topically 4 (four) times daily. 01/04/18   Mathews Robinsons B, PA-C  Guaifenesin 1200 MG TB12 Take 1 tablet (1,200 mg total) by mouth 2 (two) times daily. 10/29/18   Lawyer, Cristal Deer, PA-C  HYDROXYZINE HCL PO Take by mouth.    [provider]  ibuprofen (ADVIL,MOTRIN) 800 MG tablet Take 1 tablet (800 mg total) by mouth every 8 (eight) hours as needed. 10/29/18   Lawyer, Cristal Deer, PA-C  promethazine-dextromethorphan (PROMETHAZINE-DM) 6.25-15 MG/5ML syrup Take 5 mLs by mouth 4 (four) times daily as needed for cough. 10/29/18   Lawyer, Cristal Deer, PA-C     Allergies    Patient has no known allergies.   Review of Systems   Review of Systems Please see HPI for pertinent positives and negatives  Physical Exam BP 132/78 (BP Location: Right Arm)   Pulse (!) 108   Temp 98.2 F (36.8 C) (Oral)   Resp 18   Ht  5\' 5"  (1.651 m)   Wt 122 kg   SpO2 99%   BMI 44.76 kg/m   Physical Exam Vitals and nursing note reviewed.  Constitutional:      Appearance: Normal appearance.  HENT:     Head: Normocephalic and atraumatic.     Nose: Nose normal.     Mouth/Throat:     Mouth: Mucous membranes are moist.  Eyes:     Extraocular Movements: Extraocular movements intact.     Conjunctiva/sclera: Conjunctivae normal.  Cardiovascular:     Rate and Rhythm: Normal rate.  Pulmonary:     Effort: Pulmonary effort is normal.     Breath sounds: Normal breath sounds.  Abdominal:     General: Abdomen is flat.     Palpations: Abdomen is soft.     Tenderness: There is no abdominal tenderness.  Genitourinary:    Comments: Chaperone present, thick white vaginal discharge, no bleeding. No ulcers Musculoskeletal:        General: No swelling. Normal range of motion.     Cervical back: Neck supple.  Skin:    General: Skin is warm and dry.  Neurological:     General: No focal deficit present.     Mental Status: She is alert.  Psychiatric:        Mood and Affect: Mood normal.    ED Results / Procedures / Treatments  EKG None  Procedures Procedures  Medications Ordered in the ED Medications  fluconazole (DIFLUCAN) tablet 150 mg (has no administration in time range)    Initial Impression and Plan  Patient here with vaginal discharge. Will check wet prep, GC/CT and urine.   ED Course   Clinical Course as of 01/13/22 0336  Fri Jan 13, 2022  0333 UA with signs of infection but suspect this is contaminated sample, she is not having dysuria.  Wet prep has WBC but neg for yeast, trich or clue cells. Given the appearance of discharge, yeast is still a likely possibility. Patient would like to defer treatment of STI until that swab results but in the meantime will treat for candida with diflucan. Patient amenable to this plan.  [CS]    Clinical Course User Index [CS] Truddie Hidden, MD     MDM  Rules/Calculators/A&P Medical Decision Making Problems Addressed: Acute vaginitis: acute illness or injury  Amount and/or Complexity of Data Reviewed Labs: ordered. Decision-making details documented in ED Course.  Risk Prescription drug management.    Final Clinical Impression(s) / ED Diagnoses Final diagnoses:  Acute vaginitis    Rx / DC Orders ED Discharge Orders          Ordered    fluconazole (DIFLUCAN) 150 MG tablet   Once        01/13/22 0335             Truddie Hidden, MD 01/13/22 [phone]

## 2022-01-13 NOTE — ED Triage Notes (Signed)
Complaining of vaginal itching for 2 days, seems to be getting worse. She has tried [facility] but seems to have gotten worse

## 2022-01-16 LAB — GC/CHLAMYDIA PROBE AMP (~~LOC~~) NOT AT ARMC
Chlamydia: NEGATIVE
Comment: NEGATIVE
Comment: NORMAL
Neisseria Gonorrhea: NEGATIVE

## 2022-07-20 ENCOUNTER — Ambulatory Visit (INDEPENDENT_AMBULATORY_CARE_PROVIDER_SITE_OTHER): Payer: BC Managed Care – PPO | Admitting: Pulmonary Disease

## 2022-07-20 ENCOUNTER — Encounter (HOSPITAL_BASED_OUTPATIENT_CLINIC_OR_DEPARTMENT_OTHER): Payer: Self-pay | Admitting: Pulmonary Disease

## 2022-07-20 VITALS — BP 118/80 | HR 88 | Temp 98.8°F | Ht 66.5 in | Wt 277.0 lb

## 2022-07-20 DIAGNOSIS — R0683 Snoring: Secondary | ICD-10-CM

## 2022-07-20 DIAGNOSIS — G4733 Obstructive sleep apnea (adult) (pediatric): Secondary | ICD-10-CM | POA: Insufficient documentation

## 2022-07-20 NOTE — Assessment & Plan Note (Addendum)
Given excessive daytime somnolence, narrow pharyngeal exam & loud snoring, obstructive sleep apnea is very likely & an overnight polysomnogram will be scheduled as a home study. The pathophysiology of obstructive sleep apnea , it's cardiovascular consequences & modes of treatment including CPAP were discused with the patient in detail & they evidenced understanding.  Pretest probability is high.  She would be willing to use a CPAP if needed

## 2022-07-20 NOTE — Patient Instructions (Signed)
X Home sleep test 

## 2022-07-20 NOTE — Progress Notes (Signed)
Subjective:    Patient ID: Molly Ray, female    DOB: Sep 27, 1995, 26 y.o.   MRN: [id number]  HPI  Chief Complaint  Patient presents with   Consult    Pt states she may have sleep apnea. Pt states she snores loudly and wakes up tired.    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 Loud snoring has been noted by her boyfriend and it is the point where she has to sleep on the couch.  She reports daytime tiredness very sometimes she has to take a nap for about 60 minutes but remains tired.  If she takes a nap in the daytime she is unable to sleep at night. Epworth sleepiness score is 7. Bedtime can vary between 10 PM and 1 AM especially when she has trouble going to sleep, she sleeps on her right side with 1 pillow, when she falls asleep she denies nocturnal awakenings and is out of bed between 8 and 9 AM feeling tired with dryness of mouth but denies headaches. Weight has fluctuated between 10-20 pounds of her current weight. There is no history suggestive of cataplexy, sleep paralysis or parasomnias  PMH -PCOS Chronic sinusitis on [place] Restless leg syndrome, improved with magnesium    Past Medical History:  Diagnosis Date   Anxiety    Chronic pain    Depression    PCOS (polycystic ovarian syndrome)    History reviewed. No pertinent surgical history.  No Known Allergies  Social History   Socioeconomic History   Marital status: Single    Spouse name: Not on file   Number of children: Not on file   Years of education: Not on file   Highest education level: Not on file  Occupational History   Not on file  Tobacco Use   Smoking status: Every Day    Packs/day: 1.00    Types: Cigarettes    Passive exposure: Past   Smokeless tobacco: Never   Tobacco comments:    Pt states she smoke a pack a day as of 07/20/2022 LW.  Vaping Use   Vaping Use: Every day  Substance and Sexual Activity   Alcohol  use: Not Currently   Drug use: No   Sexual activity: Never    Birth control/protection: None  Other Topics Concern   Not on file  Social History Narrative   Not on file   Social Determinants of Health   Financial Resource Strain: Not on file  Food Insecurity: Not on file  Transportation Needs: Not on file  Physical Activity: Not on file  Stress: Not on file  Social Connections: Not on file  Intimate Partner Violence: Not on file    Family history -emphysema in mother who is a smoker. Breast cancer in grandmother and aunt    Review of Systems Shortness of breath with activity Indigestion Weight gain Nasal congestion and sneezing Anxiety and depression Jets of tests   Constitutional: negative for anorexia, fevers and sweats  Eyes: negative for irritation, redness and visual disturbance  Ears, nose, mouth, throat, and face: negative for earaches, epistaxis, nasal congestion and sore throat  Respiratory: negative for cough,  sputum and wheezing  Cardiovascular: negative for chest pain, lower extremity edema, orthopnea, palpitations and syncope  Gastrointestinal: negative for abdominal pain, constipation, diarrhea, melena, nausea and vomiting  Genitourinary:negative for dysuria, frequency and hematuria  Hematologic/lymphatic: negative for bleeding, easy bruising and lymphadenopathy  Musculoskeletal:negative for arthralgias, muscle weakness  Neurological: negative  for coordination problems, gait problems, headaches and weakness  Endocrine: negative for diabetic symptoms including polydipsia, polyuria and weight loss     Objective:   Physical Exam  Gen. Pleasant, obese, in no distress, normal affect ENT - no pallor,icterus, no post nasal drip, class 2 airway, normal bite Neck: No JVD, no thyromegaly, no carotid bruits Lungs: no use of accessory muscles, no dullness to percussion, decreased without rales or rhonchi  Cardiovascular: Rhythm regular, heart sounds  normal,  no murmurs or gallops, no peripheral edema Abdomen: soft and non-tender, no hepatosplenomegaly, BS normal. Musculoskeletal: No deformities, no cyanosis or clubbing Neuro:  alert, non focal, no tremors        Assessment & Plan:

## 2022-10-18 ENCOUNTER — Telehealth (HOSPITAL_BASED_OUTPATIENT_CLINIC_OR_DEPARTMENT_OTHER): Payer: Self-pay | Admitting: Pulmonary Disease

## 2022-10-18 NOTE — Telephone Encounter (Signed)
Pt states has not heard anything about sleep study test being scheduled since 07/16/22 OV. Please advise and call pt back with an update.

## 2022-11-07 ENCOUNTER — Telehealth: Payer: Self-pay | Admitting: Pulmonary Disease

## 2022-11-07 DIAGNOSIS — G4733 Obstructive sleep apnea (adult) (pediatric): Secondary | ICD-10-CM

## 2022-11-07 NOTE — Telephone Encounter (Signed)
Left message for patient to call back  

## 2022-11-07 NOTE — Telephone Encounter (Signed)
Patient called to request the results of her sleep study test.  Please call patient to discuss at [phone]

## 2022-11-09 NOTE — Telephone Encounter (Signed)
Patient is returning a call.  Sorry she missed the call.  Would like a call back.  CB# [phone]

## 2022-11-09 NOTE — Telephone Encounter (Signed)
Called patient and she is wanting her sleep study results.   Please advise sir

## 2022-11-14 NOTE — Telephone Encounter (Signed)
Pt called checking to see if her sleep study results were available yet and I told her that we were still waiting on Dr. Elsworth Soho to let us know what the results were. Stated to pt that we would call her once we had the results and she verbalized understanding.

## 2022-11-15 ENCOUNTER — Encounter (INDEPENDENT_AMBULATORY_CARE_PROVIDER_SITE_OTHER): Payer: BLUE CROSS/BLUE SHIELD

## 2022-11-15 DIAGNOSIS — R0683 Snoring: Secondary | ICD-10-CM

## 2022-11-15 DIAGNOSIS — G4733 Obstructive sleep apnea (adult) (pediatric): Secondary | ICD-10-CM

## 2022-11-15 NOTE — Telephone Encounter (Signed)
HST showed mild OSA with AHI 8/ hr & low sat 89% Since she is very symptomatic ,would recommend Rx with CPAP Suggest autoCPAP  5-15 cm, mask of choice OV with me/APP in 6 wks after starting CPAP

## 2022-11-21 NOTE — Telephone Encounter (Signed)
Patient would like to know her next plan of care. Patient phone number is [phone].

## 2022-11-21 NOTE — Telephone Encounter (Signed)
Called and spoke with patient. Patient verbalized understanding. Order has been placed for CPAP for patient.   Nothing further needed.

## 2023-05-30 DIAGNOSIS — G4733 Obstructive sleep apnea (adult) (pediatric): Secondary | ICD-10-CM | POA: Diagnosis not present

## 2023-05-30 DIAGNOSIS — E282 Polycystic ovarian syndrome: Secondary | ICD-10-CM | POA: Diagnosis not present

## 2023-05-30 DIAGNOSIS — F4310 Post-traumatic stress disorder, unspecified: Secondary | ICD-10-CM | POA: Diagnosis not present

## 2023-05-30 DIAGNOSIS — E559 Vitamin D deficiency, unspecified: Secondary | ICD-10-CM | POA: Diagnosis not present

## 2023-05-30 DIAGNOSIS — L309 Dermatitis, unspecified: Secondary | ICD-10-CM | POA: Diagnosis not present

## 2023-05-30 DIAGNOSIS — F331 Major depressive disorder, recurrent, moderate: Secondary | ICD-10-CM | POA: Diagnosis not present

## 2023-05-30 DIAGNOSIS — Z0000 Encounter for general adult medical examination without abnormal findings: Secondary | ICD-10-CM | POA: Diagnosis not present

## 2023-06-05 DIAGNOSIS — F4310 Post-traumatic stress disorder, unspecified: Secondary | ICD-10-CM | POA: Diagnosis not present

## 2023-06-11 DIAGNOSIS — F4310 Post-traumatic stress disorder, unspecified: Secondary | ICD-10-CM | POA: Diagnosis not present

## 2023-06-13 ENCOUNTER — Encounter (INDEPENDENT_AMBULATORY_CARE_PROVIDER_SITE_OTHER): Payer: Self-pay | Admitting: Physician Assistant

## 2023-06-13 ENCOUNTER — Ambulatory Visit (INDEPENDENT_AMBULATORY_CARE_PROVIDER_SITE_OTHER): Payer: 59 | Admitting: Physician Assistant

## 2023-06-13 VITALS — BP 105/73 | HR 102 | Temp 99.4°F | Ht 65.5 in | Wt 311.0 lb

## 2023-06-13 DIAGNOSIS — E282 Polycystic ovarian syndrome: Secondary | ICD-10-CM | POA: Diagnosis not present

## 2023-06-13 DIAGNOSIS — F339 Major depressive disorder, recurrent, unspecified: Secondary | ICD-10-CM | POA: Insufficient documentation

## 2023-06-13 DIAGNOSIS — G4733 Obstructive sleep apnea (adult) (pediatric): Secondary | ICD-10-CM

## 2023-06-13 DIAGNOSIS — E66813 Obesity, class 3: Secondary | ICD-10-CM | POA: Diagnosis not present

## 2023-06-13 DIAGNOSIS — Z0289 Encounter for other administrative examinations: Secondary | ICD-10-CM

## 2023-06-13 DIAGNOSIS — Z6841 Body Mass Index (BMI) 40.0 and over, adult: Secondary | ICD-10-CM | POA: Diagnosis not present

## 2023-06-13 DIAGNOSIS — R7303 Prediabetes: Secondary | ICD-10-CM | POA: Diagnosis not present

## 2023-06-13 DIAGNOSIS — E559 Vitamin D deficiency, unspecified: Secondary | ICD-10-CM | POA: Insufficient documentation
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
## 2023-06-19 DIAGNOSIS — F4310 Post-traumatic stress disorder, unspecified: Secondary | ICD-10-CM | POA: Diagnosis not present

## 2023-06-20 ENCOUNTER — Encounter (INDEPENDENT_AMBULATORY_CARE_PROVIDER_SITE_OTHER): Payer: Self-pay | Admitting: Physician Assistant

## 2023-06-20 DIAGNOSIS — F4310 Post-traumatic stress disorder, unspecified: Secondary | ICD-10-CM | POA: Diagnosis not present

## 2023-06-22 DIAGNOSIS — G4733 Obstructive sleep apnea (adult) (pediatric): Secondary | ICD-10-CM | POA: Diagnosis not present

## 2023-06-26 DIAGNOSIS — F4310 Post-traumatic stress disorder, unspecified: Secondary | ICD-10-CM | POA: Diagnosis not present

## 2023-07-02 ENCOUNTER — Telehealth: Payer: Self-pay | Admitting: Pulmonary Disease

## 2023-07-02 NOTE — Telephone Encounter (Signed)
PT states she is not getting enough pressure on her new CPAP machine. Please call to advise. [phone]

## 2023-07-09 NOTE — Telephone Encounter (Signed)
Per [facility] patient is not currently using CPAP   Will need OV to discuss pressure settings/changes. Not seen since 07/2022.

## 2023-07-10 ENCOUNTER — Encounter (INDEPENDENT_AMBULATORY_CARE_PROVIDER_SITE_OTHER): Payer: Self-pay | Admitting: Internal Medicine

## 2023-07-10 ENCOUNTER — Ambulatory Visit (INDEPENDENT_AMBULATORY_CARE_PROVIDER_SITE_OTHER): Payer: 59 | Admitting: Internal Medicine

## 2023-07-10 VITALS — BP 121/80 | HR 86 | Temp 98.4°F | Ht 66.0 in | Wt 319.0 lb

## 2023-07-10 DIAGNOSIS — R7303 Prediabetes: Secondary | ICD-10-CM

## 2023-07-10 DIAGNOSIS — E282 Polycystic ovarian syndrome: Secondary | ICD-10-CM

## 2023-07-10 DIAGNOSIS — G4733 Obstructive sleep apnea (adult) (pediatric): Secondary | ICD-10-CM | POA: Diagnosis not present

## 2023-07-10 DIAGNOSIS — Z6841 Body Mass Index (BMI) 40.0 and over, adult: Secondary | ICD-10-CM

## 2023-07-10 DIAGNOSIS — E66813 Obesity, class 3: Secondary | ICD-10-CM | POA: Diagnosis not present

## 2023-07-10 DIAGNOSIS — E559 Vitamin D deficiency, unspecified: Secondary | ICD-10-CM | POA: Diagnosis not present

## 2023-07-10 DIAGNOSIS — R0602 Shortness of breath: Secondary | ICD-10-CM

## 2023-07-10 DIAGNOSIS — Z1331 Encounter for screening for depression: Secondary | ICD-10-CM

## 2023-07-10 DIAGNOSIS — R5383 Other fatigue: Secondary | ICD-10-CM | POA: Diagnosis not present

## 2023-07-10 NOTE — Assessment & Plan Note (Signed)
We we will check insulin levels.  We discussed focusing on low glycemic index foods and avoiding refined carbohydrates and sugary foods.  Physical activity is also recommended.  Losing 5 to 10% of body weight may improve condition.  If she has insulin resistance she may benefit from treatment with metformin.

## 2023-07-10 NOTE — Progress Notes (Signed)
Office: [phone]  /  Fax: [phone]   Subjective   Initial Visit  Molly Ray (MR# [id number]) is an 27 y.o. female who presents for evaluation and treatment of obesity and related comorbidities. Current BMI is Body mass index is 51.49 kg/m. Molly Ray has been struggling with her weight for many years and has been unsuccessful in either losing weight, maintaining weight loss, or reaching her healthy weight goal.  Molly Ray is currently in the action stage of change and ready to dedicate time achieving and maintaining a healthier weight. Molly Ray is interested in becoming our patient and working on intensive lifestyle modifications including (but not limited to) diet and exercise for weight loss.  When asked how their weight has affected their life and health, she states: Has affected self-esteem, Contributed to medical problems, Contributed to orthopedic problems or mobility issues, Having fatigue, Having poor endurance, Problems with eating patterns, and Has affected mood   When asked what else they would like to accomplish? She states: Adopt healthier eating patterns, Improve energy levels and physical activity, Improve existing medical conditions, Reduce number of medications, Improve quality of life, Improve self-confidence, and Lose a target amount of weight : reach a healthier weight  Weight history:  She starting to note weight gain during : teens.  Life events associated with weight gain include : mental health problems.   Other contributing factors: Family history of obesity, Disruption of circadian rhythm / sleep disordered breathing, Consumption of processed foods, Use of obesogenic medications: Psychotropic medications and Anticholinergics, Moderate to high levels of stress, Reduced physical activity, Eating patterns, and Mental health problems.  Their highest weight has been:  319 current weight lbs.  Previous weight-loss programs : Intermittent fasting.  Their maximum  weight loss was:  0 lbs.  Their greatest challenge with dieting: meal preparation and cooking.  Weight promoting medications identified: Psychotropic medications and Anticholinergics  Current or previous pharmacotherapy: Phentermine and Topiramate. 6 months each  Response to medication: Lost weight initially but was unable to sustain weight loss - phentermine - no repsonse to topiramate  Nutritional History:  Current nutrition plan: None.  How often do they eat breakfast : none, they skip breakfast.  Number of times they eat per day: 1, skips meals , eat one large meal in the evening  What beverages do they drink: regular soda 1-2 per week. Beer one at night  Use of artificial sweetners : No  Food intolerances or restrictions: none.  Food triggers: Stress, Seeking reward, and To help comfort.  Food cravings: Starches  Current level of physical activity: None  Past medical history includes:   Past Medical History:  Diagnosis Date   Anxiety    B12 deficiency    Back pain    Chronic pain    Constipation    Depression    Edema of both lower extremities    Fatty liver    GAD (generalized anxiety disorder)    GERD (gastroesophageal reflux disease)    High cholesterol    Joint pain    OSA (obstructive sleep apnea)    Palpitations    PCOS (polycystic ovarian syndrome)    Prediabetes      Objective   BP 121/80   Pulse 86   Temp 98.4 F (36.9 C)   Ht 5\' 6"  (1.676 m)   Wt (!) 319 lb (144.7 kg)   SpO2 100%   BMI 51.49 kg/m  She was weighed on the bioimpedance scale: Body mass index is 51.49 kg/m.  Anthropometrics:  Vitals Temp: 98.4 F (36.9 C) BP: 121/80 Pulse Rate: 86 SpO2: 100 %   Anthropometric Measurements Height: 5\' 6"  (1.676 m) Weight: (!) 319 lb (144.7 kg) BMI (Calculated): 51.51 Starting Weight: 319 lb Peak Weight: 319 lb Waist Measurement : 56 inches   Body Composition  Body Fat %: 53.2 % Fat Mass (lbs): 170.2 lbs Muscle Mass  (lbs): 142 lbs Total Body Water (lbs): 114 lbs Visceral Fat Rating : 17   Other Clinical Data RMR: 2174 Fasting: Yes Labs: Yes Today's Visit #: 1 Starting Date: 07/10/23    Physical Exam:  General: She is overweight, cooperative, alert, well developed, and in no acute distress. PSYCH: Has normal mood, affect and thought process.   HEENT: EOMI, sclerae are anicteric. Lungs: Normal breathing effort, no conversational dyspnea. Extremities: No edema.  Neurologic: No gross sensory or motor deficits. No tremors or fasciculations noted.    Diagnostic Data Reviewed  EKG: Normal sinus rhythm, rate 81 BPM.  Indirect Calorimeter completed today shows a VO2 of 314 and a REE of 2174.  Her calculated basal metabolic rate is 8413 thus her resting energy expenditure same as calculated.  Depression Screen  Molly Ray's PHQ-9 score was: 12.      No data to display          Screening for Sleep Related Breathing Disorders  Molly Ray admits to daytime somnolence and admits to waking up still tired. Patient has a history of symptoms of daytime fatigue and morning fatigue. Molly Ray generally gets 7 hours of sleep per night, and states that she has generally restful sleep while with CPAP use. Snoring is present. Apneic episodes are present. Epworth Sleepiness Score is 5.   BMET No results found for: "NA", "K", "CL", "CO2", "GLUCOSE", "BUN", "CREATININE", "CALCIUM", "GFRNONAA", "GFRAA" No results found for: "HGBA1C" No results found for: "INSULIN" CBC No results found for: "WBC", "RBC", "HGB", "HCT", "PLT", "MCV", "MCH", "MCHC", "RDW" Iron/TIBC/Ferritin/ %Sat No results found for: "IRON", "TIBC", "FERRITIN", "IRONPCTSAT" Lipid Panel  No results found for: "CHOL", "TRIG", "HDL", "CHOLHDL", "VLDL", "LDLCALC", "LDLDIRECT" Hepatic Function Panel  No results found for: "PROT", "ALBUMIN", "AST", "ALT", "ALKPHOS", "BILITOT", "BILIDIR", "IBILI" No results found for: "TSH"   Assessment and Plan    TREATMENT PLAN FOR OBESITY:  Recommended Dietary Goals  Molly Ray is currently in the action stage of change. As such, her goal is to implement medically supervised weight loss plan.  She has agreed to implement: portion control, balanced plate and making smarter food choices, such as increasing vegetables, protein intake and reducing simple carbohydrates and processed foods  and the Category 3 plan - 1500 kcal per day  Behavioral Intervention  We discussed the following Behavioral Modification Strategies today: increasing lean protein intake to established goals, decreasing simple carbohydrates , increasing vegetables, increasing lower glycemic fruits, increasing fiber rich foods, avoiding skipping meals, increasing water intake, work on meal planning and preparation, reading food labels , keeping healthy foods at home, identifying sources and decreasing liquid calories, decreasing eating out or consumption of processed foods, and making healthy choices when eating convenient foods, planning for success, and better snacking choices  Additional resources provided today:  Cat 3 packet  Recommended Physical Activity Goals  Jimika has been advised to work up to 150 minutes of moderate intensity aerobic activity a week and strengthening exercises 2-3 times per week for cardiovascular health, weight loss maintenance and preservation of muscle mass.   She has agreed to :  Think about enjoyable ways to increase  daily physical activity and overcoming barriers to exercise and Increase physical activity in their day and reduce sedentary time (increase NEAT).  Pharmacotherapy We will work on building a Therapist, art and behavioral strategies. We will discuss the role of pharmacotherapy as an adjunct at subsequent visits.   ASSOCIATED CONDITIONS ADDRESSED TODAY  Other Fatigue  Ludmila admits to daytime somnolence and admits to waking up still tired. Patient has a history of  symptoms of daytime fatigue and morning fatigue. Azia generally gets 7 hours of sleep per night, and states that she has generally restful sleep while with CPAP use. Snoring is present. Apneic episodes are present. Epworth Sleepiness Score is 5. . Lakisa does feel that her weight is causing her energy to be lower than it should be. Fatigue may be related to obesity, depression or many other causes. Labs will be ordered, and in the meanwhile, Anaiza will focus on self care including making healthy food choices, increasing physical activity and focusing on stress reduction.  Shortness of Breath Raya notes increasing shortness of breath with exercising and seems to be worsening over time with weight gain. She notes getting out of breath sooner with activity than she used to. This has not gotten worse recently. Dalaysia denies shortness of breath at rest or orthopnea.Jackilyn notes increasing shortness of breath with exercising and seems to be worsening over time with weight gain. She notes getting out of breath sooner with activity than she used to. This has not gotten worse recently. Lanice denies shortness of breath at rest or orthopnea.  Other fatigue -     EKG 12-Lead -     Vitamin B12 -     CBC with Differential/Platelet  SOB (shortness of breath) on exertion  Depression screen  OSA (obstructive sleep apnea) on CPAP Assessment & Plan: Severe OSA on CPAP.  This may affect her ability to lose weight.  Discussed the importance of compliance with PAP therapy.  Losing 10 to 15% of body weight may reduce AHI.   PCOS (polycystic ovarian syndrome) Assessment & Plan: We we will check insulin levels.  We discussed focusing on low glycemic index foods and avoiding refined carbohydrates and sugary foods.  Physical activity is also recommended.  Losing 5 to 10% of body weight may improve condition.  If she has insulin resistance she may benefit from treatment with metformin.   Class 3 severe  obesity due to excess calories without serious comorbidity with body mass index (BMI) of 50.0 to 59.9 in adult [facility]) Assessment & Plan: See obesity treatment plan  Orders: -     TSH -     VITAMIN D 25 Hydroxy (Vit-D Deficiency, Fractures)  Prediabetes Assessment & Plan: Per history.  Patient aware of disease state and risk of progression. This may contribute to abnormal cravings, fatigue and diabetic complications without having diabetes.   We have discussed treatment options which include: losing 7 to 10% of body weight, increasing physical activity to a goal of 150 minutes a week at moderate intensity.  Advised to maintain a diet low on simple and processed carbohydrates.  She may also be a candidate for pharmacoprophylaxis with metformin or incretin mimetic.   Check hemoglobin A1c, fasting blood sugar and A1c today   Orders: -     Comprehensive metabolic panel -     Hemoglobin A1c -     Insulin, random -     Lipid Panel With LDL/HDL Ratio  Vitamin D deficiency Assessment & Plan: Most recent  vitamin D levels  No results found for: "VD25OH"   Deficiency state associated with adiposity and may result in leptin resistance, weight gain and fatigue. Currently on vitamin D supplementation without any adverse effects.  Plan: Check vitamin D levels today for adequacy.   Orders: -     VITAMIN D 25 Hydroxy (Vit-D Deficiency, Fractures)    Follow-up  She was informed of the importance of frequent follow-up visits to maximize her success with intensive lifestyle modifications for her multiple health conditions. She was informed we would discuss her lab results at her next visit unless there is a critical issue that needs to be addressed sooner. Matie agreed to keep her next visit at the agreed upon time to discuss these results.  Attestation Statement  This is the patient's intake visit at [facility]. The patient's Health Questionnaire was reviewed at  length. Included in the packet: current and past health history, medications, allergies, ROS, gynecologic history (women only), surgical history, family history, social history, weight history, weight loss surgery history (for those that have had weight loss surgery), nutritional evaluation, mood and food questionnaire, PHQ9, Epworth questionnaire, sleep habits questionnaire, patient life and health improvement goals questionnaire. These will all be scanned into the patient's chart under media.   During the visit, I independently reviewed the patient's EKG, bioimpedance scale results, and indirect calorimetry results. I used this information to medically tailor a meal plan for the patient that will help her to lose weight and will improve her obesity-related conditions. I performed a medically necessary appropriate examination and/or evaluation. I discussed the assessment and treatment plan with the patient. The patient was provided an opportunity to ask questions and all were answered. The patient agreed with the plan and demonstrated an understanding of the instructions. Labs were ordered at this visit and will be reviewed at the next visit unless critical results need to be addressed immediately. Clinical information was updated and documented in the EMR.    Reviewed by clinician on day of visit: allergies, medications, problem list, medical history, surgical history, family history, social history, and previous encounter notes.  I have spent 60 minutes in the care of the patient today including: preparing to see patient (e.g. review and interpretation of tests, old notes ), obtaining and/or reviewing separately obtained history, performing a medically appropriate examination or evaluation, counseling and educating the patient, ordering medications, test or procedures, documenting clinical information in the electronic or other health care record, and independently interpreting results and communicating  results to the patient, family, or caregiver      Worthy Rancher, MD

## 2023-07-10 NOTE — Assessment & Plan Note (Signed)
Most recent vitamin D levels  No results found for: "VD25OH"   Deficiency state associated with adiposity and may result in leptin resistance, weight gain and fatigue. Currently on vitamin D supplementation without any adverse effects.  Plan: Check vitamin D levels today for adequacy.

## 2023-07-10 NOTE — Assessment & Plan Note (Signed)
See obesity treatment plan

## 2023-07-10 NOTE — Assessment & Plan Note (Addendum)
Severe OSA on CPAP.  This may affect her ability to lose weight.  Discussed the importance of compliance with PAP therapy.  Losing 10 to 15% of body weight may reduce AHI.

## 2023-07-10 NOTE — Assessment & Plan Note (Signed)
Per history.  Patient aware of disease state and risk of progression. This may contribute to abnormal cravings, fatigue and diabetic complications without having diabetes.   We have discussed treatment options which include: losing 7 to 10% of body weight, increasing physical activity to a goal of 150 minutes a week at moderate intensity.  Advised to maintain a diet low on simple and processed carbohydrates.  She may also be a candidate for pharmacoprophylaxis with metformin or incretin mimetic.   Check hemoglobin A1c, fasting blood sugar and A1c today

## 2023-07-11 LAB — CBC WITH DIFFERENTIAL/PLATELET
Basophils Absolute: 0 10*3/uL (ref 0.0–0.2)
Basos: 0 %
EOS (ABSOLUTE): 0.1 10*3/uL (ref 0.0–0.4)
Eos: 2 %
Hematocrit: 44 % (ref 34.0–46.6)
Hemoglobin: 14.6 g/dL (ref 11.1–15.9)
Immature Grans (Abs): 0 10*3/uL (ref 0.0–0.1)
Immature Granulocytes: 0 %
Lymphocytes Absolute: 2 10*3/uL (ref 0.7–3.1)
Lymphs: 44 %
MCH: 29.9 pg (ref 26.6–33.0)
MCHC: 33.2 g/dL (ref 31.5–35.7)
MCV: 90 fL (ref 79–97)
Monocytes Absolute: 0.5 10*3/uL (ref 0.1–0.9)
Monocytes: 11 %
Neutrophils Absolute: 2 10*3/uL (ref 1.4–7.0)
Neutrophils: 43 %
Platelets: 280 10*3/uL (ref 150–450)
RBC: 4.88 x10E6/uL (ref 3.77–5.28)
RDW: 12.7 % (ref 11.7–15.4)
WBC: 4.7 10*3/uL (ref 3.4–10.8)

## 2023-07-11 LAB — LIPID PANEL WITH LDL/HDL RATIO
Cholesterol, Total: 229 mg/dL — ABNORMAL HIGH (ref 100–199)
HDL: 49 mg/dL (ref >39)
LDL Chol Calc (NIH): 154 mg/dL — ABNORMAL HIGH (ref 0–99)
LDL/HDL Ratio: 3.1 {ratio} (ref 0.0–3.2)
Triglycerides: 142 mg/dL (ref 0–149)
VLDL Cholesterol Cal: 26 mg/dL (ref 5–40)

## 2023-07-11 LAB — COMPREHENSIVE METABOLIC PANEL
ALT: 43 [IU]/L — ABNORMAL HIGH (ref 0–32)
AST: 32 [IU]/L (ref 0–40)
Albumin: 4.1 g/dL (ref 4.0–5.0)
Alkaline Phosphatase: 100 [IU]/L (ref 44–121)
BUN/Creatinine Ratio: 13 (ref 9–23)
BUN: 12 mg/dL (ref 6–20)
Bilirubin Total: 0.2 mg/dL (ref 0.0–1.2)
CO2: 23 mmol/L (ref 20–29)
Calcium: 9.7 mg/dL (ref 8.7–10.2)
Chloride: 102 mmol/L (ref 96–106)
Creatinine, Ser: 0.91 mg/dL (ref 0.57–1.00)
Globulin, Total: 2.8 g/dL (ref 1.5–4.5)
Glucose: 98 mg/dL (ref 70–99)
Potassium: 4.8 mmol/L (ref 3.5–5.2)
Sodium: 139 mmol/L (ref 134–144)
Total Protein: 6.9 g/dL (ref 6.0–8.5)
eGFR: 88 mL/min/{1.73_m2} (ref >59)

## 2023-07-11 LAB — VITAMIN B12: Vitamin B-12: 349 pg/mL (ref 232–1245)

## 2023-07-11 LAB — TSH: TSH: 1.21 u[IU]/mL (ref 0.450–4.500)

## 2023-07-11 LAB — HEMOGLOBIN A1C
Est. average glucose Bld gHb Est-mCnc: 137 mg/dL
Hgb A1c MFr Bld: 6.4 % — ABNORMAL HIGH (ref 4.8–5.6)

## 2023-07-11 LAB — INSULIN, RANDOM: INSULIN: 30.8 u[IU]/mL — ABNORMAL HIGH (ref 2.6–24.9)

## 2023-07-11 LAB — VITAMIN D 25 HYDROXY (VIT D DEFICIENCY, FRACTURES): Vit D, 25-Hydroxy: 16.4 ng/mL — ABNORMAL LOW (ref 30.0–100.0)

## 2023-07-17 DIAGNOSIS — F4310 Post-traumatic stress disorder, unspecified: Secondary | ICD-10-CM | POA: Diagnosis not present

## 2023-07-24 ENCOUNTER — Encounter (INDEPENDENT_AMBULATORY_CARE_PROVIDER_SITE_OTHER): Payer: Self-pay | Admitting: Internal Medicine

## 2023-07-24 ENCOUNTER — Ambulatory Visit (INDEPENDENT_AMBULATORY_CARE_PROVIDER_SITE_OTHER): Payer: 59 | Admitting: Internal Medicine

## 2023-07-24 VITALS — BP 120/80 | HR 92 | Temp 98.3°F | Ht 66.0 in | Wt 324.0 lb

## 2023-07-24 DIAGNOSIS — E282 Polycystic ovarian syndrome: Secondary | ICD-10-CM

## 2023-07-24 DIAGNOSIS — Z6841 Body Mass Index (BMI) 40.0 and over, adult: Secondary | ICD-10-CM

## 2023-07-24 DIAGNOSIS — G4733 Obstructive sleep apnea (adult) (pediatric): Secondary | ICD-10-CM | POA: Diagnosis not present

## 2023-07-24 DIAGNOSIS — R7303 Prediabetes: Secondary | ICD-10-CM | POA: Diagnosis not present

## 2023-07-24 DIAGNOSIS — R748 Abnormal levels of other serum enzymes: Secondary | ICD-10-CM

## 2023-07-24 DIAGNOSIS — E88819 Insulin resistance, unspecified: Secondary | ICD-10-CM | POA: Insufficient documentation

## 2023-07-24 DIAGNOSIS — E66813 Obesity, class 3: Secondary | ICD-10-CM | POA: Diagnosis not present

## 2023-07-24 MED ORDER — METFORMIN HCL ER 500 MG PO TB24: 500.0000 mg | ORAL_TABLET | Freq: Two times a day (BID) | ORAL | Status: AC

## 2023-07-24 NOTE — Assessment & Plan Note (Signed)
Most recent A1c is  Lab Results  Component Value Date   HGBA1C 6.4 (H) 07/10/2023    Patient aware of disease state and risk of progression. This may contribute to abnormal cravings, fatigue and diabetic complications without having diabetes.   We educated patient on the carb insulin model also the differences between simple and complex carbs and glycemic load. Advised to maintain a diet low on simple and processed carbohydrates.  She will start metformin XR 500 mg twice daily we will hold off GLP-1 therapy due to restrictive eating pattern which may be worsened by the latter.

## 2023-07-24 NOTE — Assessment & Plan Note (Signed)
Severe OSA on CPAP.  She reports good compliance with CPAP therapy.  Losing 15% of body weight may improve AHI.

## 2023-07-24 NOTE — Assessment & Plan Note (Signed)
She has mild elevation of ALT has multiple risk factors for MASLD.  Her mother had a hepatitis C, she does not drink alcohol or steatogenic drugs.  She has a normal bilirubin and platelet count.  We will check viral hepatitis serologies and iron studies PT and INR as first-year evaluation.  We will also order an ultrasound of the liver.  Consider autoimmune workup because of age, but at present time no signs or symptoms of autoimmune disorder.  Losing 15% of body weight may improve condition.  She was also counseled on nutrition and is to reduce saturated fat to less than 10% of calories also continue to work on reducing simple and added sugars in her diet.  GLP-1 may be of benefit but not in the presence of restrictive eating

## 2023-07-24 NOTE — Progress Notes (Signed)
Office: [phone]  /  Fax: [phone]  Weight Summary And Biometrics  Vitals Temp: 98.3 F (36.8 C) BP: 120/80 Pulse Rate: 92 SpO2: 100 %   Anthropometric Measurements Height: 5\' 6"  (1.676 m) Weight: (!) 324 lb (147 kg) BMI (Calculated): 52.32 Weight at Last Visit: 319 lb Weight Lost Since Last Visit: 0 lb Weight Gained Since Last Visit: 5 lb Starting Weight: 319 lb Total Weight Loss (lbs): 0 lb (0 kg) Peak Weight: 319 lb   Body Composition  Body Fat %: 53.8 % Fat Mass (lbs): 174.4 lbs Muscle Mass (lbs): 142 lbs Total Body Water (lbs): 113.8 lbs Visceral Fat Rating : 18    RMR: [id number]  Today's Visit #: 2  Starting Date: 07/10/23   Subjective   Chief Complaint: Obesity  Candiss is here to discuss her progress with her obesity treatment plan. She is on the the Category 2 Plan and states she is following her eating plan approximately 0 % of the time. She states she is not exercising.  Interval History:   Discussed the use of AI scribe software for clinical note transcription with the patient, who gave verbal consent to proceed.  History of Present Illness   Regenia, a 27 year old individual with a history of obesity, sleep apnea, polycystic ovarian syndrome (PCOS), and prediabetes, presents for a follow-up on medical weight management.  She is working on gradual implementation of reduced calorie nutrition plan.  She is not exercising.  Since the last office visit, she has gained five pounds.  Rosiland reports that she has been struggling with eating regularly, often skipping meals due to a lack of hunger signals. She denies overeating or snacking excessively and has recently been making an effort to consume three meals a day. She also reports a recent decrease in carbohydrate intake, having previously consumed a significant amount of pasta and similar foods.  She has been prescribed metformin, which she has recently started taking again at a dose of 500mg   daily. She reports no side effects from this medication. She also uses a CPAP machine for her severe sleep apnea and reports regular use during sleep.  Shontell has a family history of obesity and hepatitis C, with her mother having undergone treatment for the latter. She denies any personal history of drug use or other risk factors for viral hepatitis. She also reports occasional alcohol consumption, but not to excess.  In the past, Bleu has used protein shakes to supplement her diet and has expressed willingness to resume this practice. She has also expressed interest in increasing her physical activity levels, acknowledging the potential benefits for her insulin resistance and overall health. She has access to a gym at her workplace and has considered utilizing this resource.  Tiffiny's PCOS is a concern, and she is interested in understanding how it factors into her overall health and weight management. She has been prescribed metformin, which she has recently started taking again.  In summary, Maddux presents with ongoing struggles with obesity, sleep apnea, PCOS, and prediabetes. She has recently begun making changes to her diet and medication regimen, but has yet to implement regular exercise. Her lack of regular eating patterns and suppressed appetite present additional challenges to her weight management.       Orexigenic Control:  Denies problems with appetite and hunger signals.  Denies problems with satiety and satiation.  Denies problems with eating patterns and portion control.  Denies abnormal cravings. Denies feeling deprived or restricted.   Barriers identified: low volume  of physical activity at present , medical comorbidities, difficulty implementing reduced calorie nutrition plan, and sleep apnea.   Pharmacotherapy for weight loss: She is currently taking no anti-obesity medication.   Assessment and Plan   Treatment Plan For Obesity:  Recommended Dietary  Goals  Taquita is currently in the action stage of change. As such, her goal is to continue weight management plan. She has agreed to: continue to work on implementation of reduced calorie nutrition plan (RCNP)  Behavioral Intervention  We discussed the following Behavioral Modification Strategies today: continue to work on maintaining a reduced calorie state, getting the recommended amount of protein, incorporating whole foods, making healthy choices, staying well hydrated and practicing mindfulness when eating..  Additional resources provided today: Handout on increasing daily activity and exercise goal setting  Recommended Physical Activity Goals  Pooja has been advised to work up to 150 minutes of moderate intensity aerobic activity a week and strengthening exercises 2-3 times per week for cardiovascular health, weight loss maintenance and preservation of muscle mass.   She has agreed to :  Think about enjoyable ways to increase daily physical activity and overcoming barriers to exercise and Increase physical activity in their day and reduce sedentary time (increase NEAT).  Pharmacotherapy  We discussed various medication options to help Leler with her weight loss efforts and we both agreed to : increase metformin to twice daily  Associated Conditions Addressed Today  Abnormal liver enzymes Assessment & Plan: She has mild elevation of ALT has multiple risk factors for MASLD.  Her mother had a hepatitis C, she does not drink alcohol or steatogenic drugs.  She has a normal bilirubin and platelet count.  We will check viral hepatitis serologies and iron studies PT and INR as first-year evaluation.  We will also order an ultrasound of the liver.  Consider autoimmune workup because of age, but at present time no signs or symptoms of autoimmune disorder.  Losing 15% of body weight may improve condition.  She was also counseled on nutrition and is to reduce saturated fat to less than 10%  of calories also continue to work on reducing simple and added sugars in her diet.  GLP-1 may be of benefit but not in the presence of restrictive eating  Orders: -     Gamma GT -     Hepatitis B core antibody, IgM -     Hepatitis B surface antibody,qualitative -     Hepatitis B surface antigen -     Hepatitis C antibody -     Protime-INR -     Iron and TIBC -     Ferritin -     US ABDOMEN LIMITED RUQ (LIVER/GB); Future  Prediabetes Assessment & Plan: Most recent A1c is  Lab Results  Component Value Date   HGBA1C 6.4 (H) 07/10/2023    Patient aware of disease state and risk of progression. This may contribute to abnormal cravings, fatigue and diabetic complications without having diabetes.   We educated patient on the carb insulin model also the differences between simple and complex carbs and glycemic load. Advised to maintain a diet low on simple and processed carbohydrates.  She will start metformin XR 500 mg twice daily we will hold off GLP-1 therapy due to restrictive eating pattern which may be worsened by the latter.   Orders: -     metFORMIN HCl ER; Take 1 tablet (500 mg total) by mouth 2 (two) times daily with a meal.  PCOS (polycystic ovarian  syndrome) Assessment & Plan: She has insulin resistance subtype.  We discussed focusing on low glycemic index foods and avoiding refined carbohydrates and sugary foods.  Physical activity is also recommended.  Losing 5 to 10% of body weight may improve condition.  Start metformin 500 mg twice daily   Class 3 severe obesity due to excess calories without serious comorbidity with body mass index (BMI) of 50.0 to 59.9 in adult [facility]) Assessment & Plan: See obesity treatment plan   Insulin resistance Assessment & Plan: Her HOMA-IR is 7.25 which is elevated. Optimal level < 1.9.   This is complex condition associated with genetics, ectopic fat and lifestyle factors. Insulin resistance may also result in weight gain, abnormal  cravings (particularly for carbs) and fatigue. This may result in additional weight gain and lead to pre-diabetes and diabetes if untreated. In addition, hyperinsulinemia increases cardiovascular risk, chronic inflammatory response and may increase the risk of obesity related malignancies.  Lab Results  Component Value Date   HGBA1C 6.4 (H) 07/10/2023   Lab Results  Component Value Date   INSULIN 30.8 (H) 07/10/2023   Lab Results  Component Value Date   GLUCOSE 98 07/10/2023    I find that patient is likely more susceptible to the carb insulin model than energy balance model she does have high levels of insulin and is pretty close to having diabetes.  She will work on implementing a low-carb reduced calorie nutrition plan.  She is also going to start treatment with metformin. We will hold off GLP-1 therapy because of restrictive type of eating pattern.    OSA (obstructive sleep apnea) Assessment & Plan: Severe OSA on CPAP.  She reports good compliance with CPAP therapy.  Losing 15% of body weight may improve AHI.      Objective   Physical Exam:  Blood pressure 120/80, pulse 92, temperature 98.3 F (36.8 C), height 5\' 6"  (1.676 m), weight (!) 324 lb (147 kg), SpO2 100%. Body mass index is 52.29 kg/m.  General: She is overweight, cooperative, alert, well developed, and in no acute distress. PSYCH: Has normal mood, affect and thought process.   HEENT: EOMI, sclerae are anicteric. Lungs: Normal breathing effort, no conversational dyspnea. Extremities: No edema.  Neurologic: No gross sensory or motor deficits. No tremors or fasciculations noted.    Diagnostic Data Reviewed:  BMET    Component Value Date/Time   NA 139 07/10/2023 0929   K 4.8 07/10/2023 0929   CL 102 07/10/2023 0929   CO2 23 07/10/2023 0929   GLUCOSE 98 07/10/2023 0929   BUN 12 07/10/2023 0929   CREATININE 0.91 07/10/2023 0929   CALCIUM 9.7 07/10/2023 0929   Lab Results  Component Value Date    HGBA1C 6.4 (H) 07/10/2023   Lab Results  Component Value Date   INSULIN 30.8 (H) 07/10/2023   Lab Results  Component Value Date   TSH 1.210 07/10/2023   CBC    Component Value Date/Time   WBC 4.7 07/10/2023 0929   RBC 4.88 07/10/2023 0929   HGB 14.6 07/10/2023 0929   HCT 44.0 07/10/2023 0929   PLT 280 07/10/2023 0929   MCV 90 07/10/2023 0929   MCH 29.9 07/10/2023 0929   MCHC 33.2 07/10/2023 0929   RDW 12.7 07/10/2023 0929   Iron Studies No results found for: "IRON", "TIBC", "FERRITIN", "IRONPCTSAT" Lipid Panel     Component Value Date/Time   CHOL 229 (H) 07/10/2023 0929   TRIG 142 07/10/2023 0929   HDL 49 07/10/2023  0929   LDLCALC 154 (H) 07/10/2023 0929   Hepatic Function Panel     Component Value Date/Time   PROT 6.9 07/10/2023 0929   ALBUMIN 4.1 07/10/2023 0929   AST 32 07/10/2023 0929   ALT 43 (H) 07/10/2023 0929   ALKPHOS 100 07/10/2023 0929   BILITOT 0.2 07/10/2023 0929      Component Value Date/Time   TSH 1.210 07/10/2023 0929   Nutritional Lab Results  Component Value Date   VD25OH 16.4 (L) 07/10/2023    Follow-Up   Return in about 4 weeks (around 08/21/2023) for For Weight Mangement with Dr. Rikki Spearing.Marland Kitchen She was informed of the importance of frequent follow up visits to maximize her success with intensive lifestyle modifications for her multiple health conditions.  Attestation Statement   Reviewed by clinician on day of visit: allergies, medications, problem list, medical history, surgical history, family history, social history, and previous encounter notes.     Worthy Rancher, MD

## 2023-07-24 NOTE — Telephone Encounter (Signed)
NFN 

## 2023-07-24 NOTE — Assessment & Plan Note (Signed)
She has insulin resistance subtype.  We discussed focusing on low glycemic index foods and avoiding refined carbohydrates and sugary foods.  Physical activity is also recommended.  Losing 5 to 10% of body weight may improve condition.  Start metformin 500 mg twice daily

## 2023-07-24 NOTE — Assessment & Plan Note (Signed)
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 Optimal level < 1.9.   This is complex condition associated with genetics, ectopic fat and lifestyle factors. Insulin resistance may also result in weight gain, abnormal cravings (particularly for carbs) and fatigue. This may result in additional weight gain and lead to pre-diabetes and diabetes if untreated. In addition, hyperinsulinemia increases cardiovascular risk, chronic inflammatory response and may increase the risk of obesity related malignancies.  Lab Results  Component Value Date   HGBA1C 6.4 (H) 07/10/2023   Lab Results  Component Value Date   INSULIN 30.8 (H) 07/10/2023   Lab Results  Component Value Date   GLUCOSE 98 07/10/2023    I find that patient is likely more susceptible to the carb insulin model than energy balance model she does have high levels of insulin and is pretty close to having diabetes.  She will work on implementing a low-carb reduced calorie nutrition plan.  She is also going to start treatment with metformin. We will hold off GLP-1 therapy because of restrictive type of eating pattern.

## 2023-07-24 NOTE — Assessment & Plan Note (Signed)
 See obesity treatment plan

## 2023-07-25 DIAGNOSIS — F4310 Post-traumatic stress disorder, unspecified: Secondary | ICD-10-CM | POA: Diagnosis not present

## 2023-07-25 LAB — IRON AND TIBC
Iron Saturation: 32 % (ref 15–55)
Iron: 110 ug/dL (ref 27–159)
Total Iron Binding Capacity: 345 ug/dL (ref 250–450)
UIBC: 235 ug/dL (ref 131–425)

## 2023-07-25 LAB — HEPATITIS C ANTIBODY: Hep C Virus Ab: NONREACTIVE

## 2023-07-25 LAB — HEPATITIS B SURFACE ANTIBODY,QUALITATIVE: Hep B Surface Ab, Qual: NONREACTIVE

## 2023-07-25 LAB — PROTIME-INR
INR: 1 (ref 0.9–1.2)
Prothrombin Time: 11.3 s (ref 9.1–12.0)

## 2023-07-25 LAB — HEPATITIS B SURFACE ANTIGEN: Hepatitis B Surface Ag: NEGATIVE

## 2023-07-25 LAB — FERRITIN: Ferritin: 31 ng/mL (ref 15–150)

## 2023-07-25 LAB — GAMMA GT: GGT: 52 [IU]/L (ref 0–60)

## 2023-07-25 LAB — HEPATITIS B CORE ANTIBODY, IGM: Hep B C IgM: NEGATIVE

## 2023-07-28 ENCOUNTER — Ambulatory Visit (HOSPITAL_BASED_OUTPATIENT_CLINIC_OR_DEPARTMENT_OTHER)
Admission: RE | Admit: 2023-07-28 | Discharge: 2023-07-28 | Disposition: A | Discharge status: Home / Self Care | Payer: 59 | Source: Ambulatory Visit | Attending: Internal Medicine | Admitting: Internal Medicine

## 2023-07-28 DIAGNOSIS — R7989 Other specified abnormal findings of blood chemistry: Secondary | ICD-10-CM | POA: Diagnosis not present

## 2023-07-28 DIAGNOSIS — R748 Abnormal levels of other serum enzymes: Secondary | ICD-10-CM | POA: Insufficient documentation

## 2023-08-01 DIAGNOSIS — F4310 Post-traumatic stress disorder, unspecified: Secondary | ICD-10-CM | POA: Diagnosis not present

## 2023-08-20 DIAGNOSIS — F4310 Post-traumatic stress disorder, unspecified: Secondary | ICD-10-CM | POA: Diagnosis not present

## 2023-08-27 DIAGNOSIS — F4310 Post-traumatic stress disorder, unspecified: Secondary | ICD-10-CM | POA: Diagnosis not present

## 2023-08-30 ENCOUNTER — Other Ambulatory Visit: Payer: Self-pay | Admitting: Medical Genetics

## 2023-09-03 ENCOUNTER — Ambulatory Visit (INDEPENDENT_AMBULATORY_CARE_PROVIDER_SITE_OTHER): Payer: 59 | Admitting: Internal Medicine

## 2023-09-03 ENCOUNTER — Encounter (INDEPENDENT_AMBULATORY_CARE_PROVIDER_SITE_OTHER): Payer: Self-pay | Admitting: Internal Medicine

## 2023-09-03 VITALS — BP 127/88 | HR 73 | Temp 98.5°F | Ht 66.0 in | Wt 330.0 lb

## 2023-09-03 DIAGNOSIS — R748 Abnormal levels of other serum enzymes: Secondary | ICD-10-CM

## 2023-09-03 DIAGNOSIS — G4733 Obstructive sleep apnea (adult) (pediatric): Secondary | ICD-10-CM

## 2023-09-03 DIAGNOSIS — Z6841 Body Mass Index (BMI) 40.0 and over, adult: Secondary | ICD-10-CM | POA: Diagnosis not present

## 2023-09-03 DIAGNOSIS — E66813 Obesity, class 3: Secondary | ICD-10-CM

## 2023-09-03 DIAGNOSIS — R7303 Prediabetes: Secondary | ICD-10-CM

## 2023-09-03 DIAGNOSIS — E88819 Insulin resistance, unspecified: Secondary | ICD-10-CM

## 2023-09-03 MED ORDER — ZEPBOUND 2.5 MG/0.5ML ~~LOC~~ SOAJ: 2.5000 mg | SUBCUTANEOUS | 0 refills | Status: AC

## 2023-09-03 NOTE — Assessment & Plan Note (Addendum)
Severe OSA on CPAP.  She reports good compliance with CPAP therapy.  Losing 15% of body weight may improve AHI.  We recommend Zepbound 2.5 mg once a week to assist with her weight loss efforts.

## 2023-09-03 NOTE — Progress Notes (Signed)
Office: [phone]  /  Fax: [phone]  Weight Summary And Biometrics  Vitals Temp: 98.5 F (36.9 C) BP: 127/88 Pulse Rate: 73 SpO2: 99 %   Anthropometric Measurements Height: 5\' 6"  (1.676 m) Weight: (!) 330 lb (149.7 kg) BMI (Calculated): 53.29 Weight at Last Visit: 324 lb Weight Lost Since Last Visit: 0 lb Weight Gained Since Last Visit: 6 lb Starting Weight: 319 lb Peak Weight: 319 lb   Body Composition  Body Fat %: 53.7 % Fat Mass (lbs): 177.6 lbs Muscle Mass (lbs): 145.4 lbs Total Body Water (lbs): 112.2 lbs Visceral Fat Rating : 18    RMR: [id number]  Today's Visit #: No  Starting Date: 07/10/23   Subjective   Chief Complaint: Obesity  Molly Ray is here to discuss her progress with her obesity treatment plan. She is on the the Category 2 Plan and states she is following her eating plan approximately 40 % of the time. She states she is exercising 20 minutes 2 times per week.  Weight Progress Since Last Visit:  Discussed the use of AI scribe software for clinical note transcription with the patient, who gave verbal consent to proceed.  History of Present Illness   The patient, affected by obesity, presents for a medical weight management consultation. She has a history of abnormal liver enzymes, polycystic ovary syndrome (PCOS), prediabetes with hyperinsulinemia, and severe obstructive sleep apnea (OSA). Since the last visit, the patient has gained six pounds, marking the second time she has experienced weight gain between visits.  The patient reports adherence to the prescribed dietary plan about 40% of the time, with a noted change in eating habits. Previously, she was skipping meals and consuming one large meal towards the end of the day. Currently, she is eating three times a day, ensuring protein intake with each meal, even if it's just a shake. She reports feeling hunger pains now, which she responds to by eating.  The patient has been trying to  incorporate walking into her routine but has been hindered by severe back issues. She has been attending chiropractic sessions three times a week to address this issue.  Regarding her liver health, recent blood work showed normal iron levels, ruling out iron overload syndromes. A bleeding test (Protime) indicated normal liver function. She tested negative for hepatitis B and C, and an ultrasound of the liver did not show evidence of fat accumulation.  The patient confirms using her CPAP machine every night for her severe OSA. She also reports taking metformin twice a day without any side effects. She expresses willingness to try a new medication (GLP-1 drugs) to help manage her weight, given her risk for complications due to her medical conditions.  Despite the recent weight gain, the patient notes an increase in her appetite, which is seen as a positive sign in normalizing her metabolism. She is committed to continuing her three meals a day routine, ensuring protein intake, and incorporating more fruits and vegetables into her diet.       Patient Reported Barriers to Progress: strong hunger signals and/or impaired satiety / inhibitory control, low volume of physical activity at present , and sleep apnea.   Orexigenic Control: Reports problems with appetite and hunger signals.  Reports problems with satiety and satiation.  Denies problems with eating patterns and portion control.  Denies abnormal cravings. Denies feeling deprived or restricted.   Pharmacotherapy for weight management: She is currently taking Metformin (off label use for incretin effect and / or insulin resistance  and / or diabetes prevention) with adequate clinical response  and without side effects..   Assessment and Plan   Treatment Plan For Obesity:  Recommended Dietary Goals  [name] is currently in the action stage of change. As such, her goal is to continue weight management plan. She has agreed to: continue current  plan  Behavioral Health and Counseling  We discussed the following behavioral modification strategies today: continue to work on maintaining a reduced calorie state, getting the recommended amount of protein, incorporating whole foods, making healthy choices, staying well hydrated and practicing mindfulness when eating..  Additional education and resources provided today: None  Recommended Physical Activity Goals  [name] has been advised to work up to 150 minutes of moderate intensity aerobic activity a week and strengthening exercises 2-3 times per week for cardiovascular health, weight loss maintenance and preservation of muscle mass.   She has agreed to :  continue to gradually increase the amount and intensity of exercise routine  Pharmacotherapy  We discussed various medication options to help [name] with her weight loss efforts and we both agreed to : start anti-obesity medication.  In addition to reduced calorie nutrition plan (RCNP), behavioral strategies and physical activity, [name] would benefit from pharmacotherapy to assist with hunger signals, satiety and cravings. This will reduce obesity-related health risks by inducing weight loss, and help reduce food consumption and adherence to [facility]) . It may also improve QOL by improving self-confidence and reduce the  setbacks associated with metabolic adaptations.  She also has multiple obesity sensitive comorbidities that increase her risk of future complications.  This includes prediabetes with an A1c of 6.4, close to diabetes, severe OSA and possible fatty liver disease.  She also has hyperinsulinemia.  After discussion of treatment options, mechanisms of action, benefits, side effects, contraindications and shared decision making she is agreeable to starting Zepbound 2.5 mg once a week. Patient also made aware that medication is indicated for long-term management of obesity and the risk of weight regain following discontinuation of  treatment and hence the importance of adhering to medical weight loss plan.  We demonstrated use of device and patient using teach back method was able to demonstrate proper technique.  Associated Conditions Impacted by Obesity Treatment  Class 3 severe obesity due to excess calories without serious comorbidity with body mass index (BMI) of 50.0 to 59.9 in adult [facility]) Assessment & Plan: Patient has now increased her meal intake to 3 meals a day she is noticing increasing hunger signals and has been gaining weight.  I feel that she would benefit from GLP-1 therapy.  See obesity treatment plan  Orders: -     Zepbound; Inject 2.5 mg into the skin once a week.  Dispense: 2 mL; Refill: 0  Prediabetes Assessment & Plan: Most recent A1c is  Lab Results  Component Value Date   HGBA1C 6.4 (H) 07/10/2023    Patient aware of disease state and risk of progression. This may contribute to abnormal cravings, fatigue and diabetic complications without having diabetes.   We educated patient on the carb insulin model also the differences between simple and complex carbs and glycemic load. Advised to maintain a diet low on simple and processed carbohydrates.  Currently on metformin XR 500 mg twice a day without any adverse effects.  She is a good candidate for GLP-1 she has multiple obesity related comorbid conditions.   Orders: -     Zepbound; Inject 2.5 mg into the skin once a week.  Dispense:  2 mL; Refill: 0  OSA (obstructive sleep apnea) Assessment & Plan: Severe OSA on CPAP.  She reports good compliance with CPAP therapy.  Losing 15% of body weight may improve AHI.  We recommend Zepbound 2.5 mg once a week to assist with her weight loss efforts.  Orders: -     Zepbound; Inject 2.5 mg into the skin once a week.  Dispense: 2 mL; Refill: 0  Abnormal liver enzymes Assessment & Plan: Patient has multiple risk factors for MASLD.  Her hepatitis serologies and iron studies were negative.  Liver  ultrasound did not show hepatic steatosis.  She may still have fatty liver disease with normal imaging.  She is a good candidate for GLP-1 therapy.  Losing 15% of body weight may improve condition.  Also maintaining a diet low in saturated fats and simple sugars.   Insulin resistance Assessment & Plan: Her HOMA-IR is 7.25 which is elevated. Optimal level < 1.9.   This is complex condition associated with genetics, ectopic fat and lifestyle factors. Insulin resistance may also result in weight gain, abnormal cravings (particularly for carbs) and fatigue. This may result in additional weight gain and lead to pre-diabetes and diabetes if untreated. In addition, hyperinsulinemia increases cardiovascular risk, chronic inflammatory response and may increase the risk of obesity related malignancies.  Lab Results  Component Value Date   HGBA1C 6.4 (H) 07/10/2023   Lab Results  Component Value Date   INSULIN 30.8 (H) 07/10/2023   Lab Results  Component Value Date   GLUCOSE 98 07/10/2023    I find that patient is likely more susceptible to the carb insulin model than energy balance model she does have high levels of insulin and is pretty close to having diabetes.  She is currently on metformin XR 500 mg twice daily without any adverse effects.  She will be started on Zepbound 2.5 mg once a week.          Objective   Physical Exam:  Blood pressure 127/88, pulse 73, temperature 98.5 F (36.9 C), height 5\' 6"  (1.676 m), weight (!) 330 lb (149.7 kg), SpO2 99%. Body mass index is 53.26 kg/m.  General: She is overweight, cooperative, alert, well developed, and in no acute distress. PSYCH: Has normal mood, affect and thought process.   HEENT: EOMI, sclerae are anicteric. Lungs: Normal breathing effort, no conversational dyspnea. Extremities: No edema.  Neurologic: No gross sensory or motor deficits. No tremors or fasciculations noted.    Diagnostic Data Reviewed:  BMET     Component Value Date/Time   NA 139 07/10/2023 0929   K 4.8 07/10/2023 0929   CL 102 07/10/2023 0929   CO2 23 07/10/2023 0929   GLUCOSE 98 07/10/2023 0929   BUN 12 07/10/2023 0929   CREATININE 0.91 07/10/2023 0929   CALCIUM 9.7 07/10/2023 0929   Lab Results  Component Value Date   HGBA1C 6.4 (H) 07/10/2023   Lab Results  Component Value Date   INSULIN 30.8 (H) 07/10/2023   Lab Results  Component Value Date   TSH 1.210 07/10/2023   CBC    Component Value Date/Time   WBC 4.7 07/10/2023 0929   RBC 4.88 07/10/2023 0929   HGB 14.6 07/10/2023 0929   HCT 44.0 07/10/2023 0929   PLT 280 07/10/2023 0929   MCV 90 07/10/2023 0929   MCH 29.9 07/10/2023 0929   MCHC 33.2 07/10/2023 0929   RDW 12.7 07/10/2023 0929   Iron Studies    Component Value Date/Time  IRON 110 07/24/2023 1441   TIBC 345 07/24/2023 1441   FERRITIN 31 07/24/2023 1441   IRONPCTSAT 32 07/24/2023 1441   Lipid Panel     Component Value Date/Time   CHOL 229 (H) 07/10/2023 0929   TRIG 142 07/10/2023 0929   HDL 49 07/10/2023 0929   LDLCALC 154 (H) 07/10/2023 0929   Hepatic Function Panel     Component Value Date/Time   PROT 6.9 07/10/2023 0929   ALBUMIN 4.1 07/10/2023 0929   AST 32 07/10/2023 0929   ALT 43 (H) 07/10/2023 0929   ALKPHOS 100 07/10/2023 0929   BILITOT 0.2 07/10/2023 0929      Component Value Date/Time   TSH 1.210 07/10/2023 0929   Nutritional Lab Results  Component Value Date   VD25OH 16.4 (L) 07/10/2023    Follow-Up   Return in about 3 weeks (around 09/24/2023) for For Weight Mangement with Dr. Rikki Spearing.Marland Kitchen She was informed of the importance of frequent follow up visits to maximize her success with intensive lifestyle modifications for her multiple health conditions.  Attestation Statement   Reviewed by clinician on day of visit: allergies, medications, problem list, medical history, surgical history, family history, social history, and previous encounter notes.      Worthy Rancher, MD

## 2023-09-03 NOTE — Assessment & Plan Note (Signed)
Patient has multiple risk factors for MASLD.  Her hepatitis serologies and iron studies were negative.  Liver ultrasound did not show hepatic steatosis.  She may still have fatty liver disease with normal imaging.  She is a good candidate for GLP-1 therapy.  Losing 15% of body weight may improve condition.  Also maintaining a diet low in saturated fats and simple sugars.

## 2023-09-03 NOTE — Assessment & Plan Note (Signed)
Patient has now increased her meal intake to 3 meals a day she is noticing increasing hunger signals and has been gaining weight.  I feel that she would benefit from GLP-1 therapy.  See obesity treatment plan

## 2023-09-03 NOTE — Assessment & Plan Note (Signed)
Most recent A1c is  Lab Results  Component Value Date   HGBA1C 6.4 (H) 07/10/2023    Patient aware of disease state and risk of progression. This may contribute to abnormal cravings, fatigue and diabetic complications without having diabetes.   We educated patient on the carb insulin model also the differences between simple and complex carbs and glycemic load. Advised to maintain a diet low on simple and processed carbohydrates.  Currently on metformin XR 500 mg twice a day without any adverse effects.  She is a good candidate for GLP-1 she has multiple obesity related comorbid conditions.

## 2023-09-03 NOTE — Assessment & Plan Note (Signed)
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 Optimal level < 1.9.   This is complex condition associated with genetics, ectopic fat and lifestyle factors. Insulin resistance may also result in weight gain, abnormal cravings (particularly for carbs) and fatigue. This may result in additional weight gain and lead to pre-diabetes and diabetes if untreated. In addition, hyperinsulinemia increases cardiovascular risk, chronic inflammatory response and may increase the risk of obesity related malignancies.  Lab Results  Component Value Date   HGBA1C 6.4 (H) 07/10/2023   Lab Results  Component Value Date   INSULIN 30.8 (H) 07/10/2023   Lab Results  Component Value Date   GLUCOSE 98 07/10/2023    I find that patient is likely more susceptible to the carb insulin model than energy balance model she does have high levels of insulin and is pretty close to having diabetes.  She is currently on metformin XR 500 mg twice daily without any adverse effects.  She will be started on Zepbound 2.5 mg once a week.

## 2023-09-04 DIAGNOSIS — F4310 Post-traumatic stress disorder, unspecified: Secondary | ICD-10-CM | POA: Diagnosis not present

## 2023-09-17 ENCOUNTER — Ambulatory Visit (HOSPITAL_BASED_OUTPATIENT_CLINIC_OR_DEPARTMENT_OTHER): Payer: 59 | Admitting: Pulmonary Disease

## 2023-09-17 ENCOUNTER — Encounter (HOSPITAL_BASED_OUTPATIENT_CLINIC_OR_DEPARTMENT_OTHER): Payer: Self-pay | Admitting: Pulmonary Disease

## 2023-09-17 VITALS — BP 132/84 | HR 74 | Resp 16 | Ht 66.0 in | Wt 338.0 lb

## 2023-09-17 DIAGNOSIS — G4733 Obstructive sleep apnea (adult) (pediatric): Secondary | ICD-10-CM | POA: Diagnosis not present

## 2023-09-17 NOTE — Progress Notes (Addendum)
Subjective:    Patient ID: Molly Ray, female    DOB: [birth year], 28 y.o.   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  History of Present Illness   The patient, with a history of obstructive sleep apnea (OSA), presents for a follow-up visit after starting CPAP therapy. She reports using the CPAP machine every night and has adjusted well to the nasal mask and headgear. She denies any issues with pressure or dry mouth in the morning. She also reports improved rest and sleep quality with the use of CPAP and sleep medicine for anxiety. 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 The patient also mentions a recent weight gain due to PCOS and is awaiting approval for a new weight loss medication, Zepbound.      CPAP download until October shows settings have changed from 6 to 8 cm, no residual events, mild leak.  Good usage more than 6.5 hours every night.   CPAP download for 3 months on her new machine shows good control of events on auto settings 5 to 15 cm with average pressure of 13 maximum pressure of 14 cm.  She is very compliant more than 6 hours per night with minimal leak. CPAP has certainly helped improve her daytime somnolence and fatigue  Significant tests/ events reviewed  10/2022 HST showed mild OSA with AHI 8/ hr & low sat 89%   Review of Systems neg for any significant sore throat, dysphagia, itching, sneezing, nasal congestion or excess/ purulent secretions, fever, chills, sweats, unintended wt loss, pleuritic or exertional cp, hempoptysis, orthopnea pnd or change in chronic leg swelling. Also denies presyncope, palpitations, heartburn, abdominal pain, nausea,  vomiting, diarrhea or change in bowel or urinary habits, dysuria,hematuria, rash, arthralgias, visual complaints, headache, numbness weakness or ataxia.     Objective:   Physical Exam  Gen. Pleasant, obese, in no distress ENT - no lesions, no post nasal drip Neck: No JVD, no thyromegaly, no carotid bruits Lungs: no use of accessory muscles, no dullness to percussion, decreased without rales or rhonchi  Cardiovascular: Rhythm regular, heart sounds  normal, no murmurs or gallops, no peripheral edema Musculoskeletal: No deformities, no cyanosis or clubbing , no tremors       Assessment & Plan:    Assessment and Plan    Obstructive Sleep Apnea (OSA) Follow-up on CPAP therapy. Using CPAP nightly with good adherence to nasal mask. Reports feeling rested with minimal daytime sleepiness, an improvement from initial symptoms. New CPAP machine in use for 2-3 months after system failure of the previous one. Previous machine's report up to October 30th showed adequate usage and pressure settings. Weight fluctuations may affect CPAP pressure needs. Discussed potential need for auto settings to accommodate weight changes. Significant weight changes (5-10%) may necessitate pressure adjustments. - Check new CPAP machine report for appropriate settings - Consider auto settings for CPAP to accommodate weight fluctuations - Contact clinic if experiencing CPAP issues - Fill prescriptions for CPAP supplies for the year  Polycystic Ovary Syndrome (PCOS) PCOS contributing to weight gain, affecting OSA management. Gained from 277 to 338 lbs.Working on [employer] and considering Zepbound. Weight loss may impact CPAP pressure needs. - Monitor weight and adjust CPAP  settings as needed - Support weight management with Zepbound  Anxiety Using hydroxyzine for anxiety, aiding in sleep quality. - Not a long term option  Follow-up - Schedule follow-up visit in one year - Contact clinic if experiencing  CPAP issues.

## 2023-09-17 NOTE — Patient Instructions (Signed)
x CPAP report for the last 3 months from [place] We will adjust pressure if needed  Good luck with Zepbound

## 2023-09-18 ENCOUNTER — Encounter (INDEPENDENT_AMBULATORY_CARE_PROVIDER_SITE_OTHER): Payer: Self-pay

## 2023-09-22 DIAGNOSIS — G4733 Obstructive sleep apnea (adult) (pediatric): Secondary | ICD-10-CM | POA: Diagnosis not present

## 2023-10-01 ENCOUNTER — Encounter (INDEPENDENT_AMBULATORY_CARE_PROVIDER_SITE_OTHER): Payer: Self-pay | Admitting: Internal Medicine

## 2023-10-01 ENCOUNTER — Ambulatory Visit (INDEPENDENT_AMBULATORY_CARE_PROVIDER_SITE_OTHER): Payer: 59 | Admitting: Internal Medicine

## 2023-10-01 VITALS — BP 132/82 | HR 100 | Temp 98.0°F | Ht 66.0 in | Wt 330.0 lb

## 2023-10-01 DIAGNOSIS — G4733 Obstructive sleep apnea (adult) (pediatric): Secondary | ICD-10-CM | POA: Diagnosis not present

## 2023-10-01 DIAGNOSIS — R7303 Prediabetes: Secondary | ICD-10-CM

## 2023-10-01 DIAGNOSIS — E66813 Obesity, class 3: Secondary | ICD-10-CM

## 2023-10-01 DIAGNOSIS — Z6841 Body Mass Index (BMI) 40.0 and over, adult: Secondary | ICD-10-CM | POA: Diagnosis not present

## 2023-10-01 MED ORDER — PHENTERMINE-TOPIRAMATE ER 7.5-46 MG PO CP24: 1.0000 | ORAL_CAPSULE | Freq: Every day | ORAL | 0 refills | Status: AC

## 2023-10-01 MED ORDER — QSYMIA 3.75-23 MG PO CP24: 1.0000 | ORAL_CAPSULE | Freq: Every day | ORAL | 0 refills | Status: AC

## 2023-10-01 NOTE — Assessment & Plan Note (Signed)
 Severe OSA on CPAP.  She reports good compliance with CPAP therapy.  Losing 15% of body weight may improve AHI.  We recommend Zepbound 2.5 mg once a week to assist with her weight loss efforts.  She will look into savings card

## 2023-10-01 NOTE — Progress Notes (Signed)
 Office: [phone]  /  Fax: [phone]  Weight Summary And Biometrics  Vitals Temp: 98 F (36.7 C) BP: 132/82 Pulse Rate: 100 SpO2: 100 %   Anthropometric Measurements Height: 5\' 6"  (1.676 m) Weight: (!) 330 lb (149.7 kg) BMI (Calculated): 53.29 Weight at Last Visit: 330 lb Weight Lost Since Last Visit: 1 lb Weight Gained Since Last Visit: 0 Starting Weight: 319 lb Total Weight Loss (lbs): 0 lb (0 kg)   Body Composition  Body Fat %: 54.3 % Fat Mass (lbs): 179 lbs Muscle Mass (lbs): 143 lbs Total Body Water (lbs): 112.2 lbs Visceral Fat Rating : 19    No data recorded Today's Visit #: 3  Starting Date: 07/10/23   Subjective   Chief Complaint: Obesity  Molly Ray is here to discuss her progress with her obesity treatment plan. She is on the the Category 3 Plan and states she is following her eating plan approximately 40 % of the time. She states she is exercising 20-30 minutes 2 times per week. She is currently walking on the treadmill.  Interval History:   Discussed the use of AI scribe software for clinical note transcription with the patient, who gave verbal consent to proceed.  History of Present Illness   Molly Ray is a 28 year old female with obesity and sleep apnea who presents for medical weight management.  She is currently on a 1500 calorie diet plan. She follows the plan about forty percent of the time, with difficulties maintaining her eating schedule on weekends. Despite these challenges, she has started to lose weight, marking the first time she has lost weight after previously gaining.  Prescribed Zepbound for her out-of-pocket expense was about $1000.  She did not apply savings card.  She has previously been on phentermine and topiramate separately for a couple of months each and experienced weight loss during that time. However, she stopped the medications due to not being able to continue doctor visits when she was out of work.  She  has recently cut out coffee in the morning, opting to drink only water throughout the day. She is currently sexually active and has an IUD for birth control. No issues with amphetamines in the past.      Orexigenic Control:  Reports problems with appetite and hunger signals.  Reports problems with satiety and satiation.  Denies problems with eating patterns and portion control.  Denies abnormal cravings. Denies feeling deprived or restricted.   Barriers identified: strong hunger signals and/or impaired satiety / inhibitory control, difficulty implementing reduced calorie nutrition plan, difficulty maintaining a reduced calorie state, and sleep apnea.   Pharmacotherapy for weight loss: She is currently taking Metformin (off label use for incretin effect and / or insulin resistance and / or diabetes prevention) with adequate clinical response  and without side effects..   Assessment and Plan   Treatment Plan For Obesity:  Recommended Dietary Goals  Molly Ray is currently in the action stage of change. As such, her goal is to continue weight management plan. She has agreed to: continue to work on implementation of reduced calorie nutrition plan (RCNP)  Behavioral Intervention  We discussed the following Behavioral Modification Strategies today: continue to work on maintaining a reduced calorie state, getting the recommended amount of protein, incorporating whole foods, making healthy choices, staying well hydrated and practicing mindfulness when eating..  Additional resources provided today: None  Recommended Physical Activity Goals  Molly Ray has been advised to work up to 150 minutes of moderate  intensity aerobic activity a week and strengthening exercises 2-3 times per week for cardiovascular health, weight loss maintenance and preservation of muscle mass.   She has agreed to :  continue to gradually increase the amount and intensity of exercise routine  Pharmacotherapy  We  discussed various medication options to help Molly Ray with her weight loss efforts and we both agreed to :  She will first try to see out-of-pocket expense for Zepbound using savings card.  Medication is cost prohibitive and she will start Qsymia.  We discussed the benefits and side effects of medication she has no contraindications.  We reviewed state registry for controlled substances.  Shows previous records of the use of phentermine consistent with history  Associated Conditions Addressed Today  Class 3 severe obesity due to excess calories without serious comorbidity with body mass index (BMI) of 50.0 to 59.9 in adult Molly Ray) Assessment & Plan: - Refer to obesity management plan - Continue with current weight management strategy. - Discussed adjustments to overcome identified barriers. - Reinforce dietary and exercise goals. -Check cost of Zepbound pharmacy using savings card if cost.  If start Qsymia for appetite suppression.   Orders: -     Qsymia; Take 1 capsule by mouth daily.  Dispense: 14 capsule; Refill: 0 -     Phentermine-Topiramate; Take 1 capsule by mouth daily.  Dispense: 30 capsule; Refill: 0  OSA (obstructive sleep apnea) Assessment & Plan: Severe OSA on CPAP.  She reports good compliance with CPAP therapy.  Losing 15% of body weight may improve AHI.  We recommend Zepbound 2.5 mg once a week to assist with her weight loss efforts.  She will look into savings card   Prediabetes Assessment & Plan: Most recent A1c is  Lab Results  Component Value Date   HGBA1C 6.4 (H) 07/10/2023    Patient aware of disease state and risk of progression. This may contribute to abnormal cravings, fatigue and diabetic complications without having diabetes.   Currently on metformin XR 500 mg twice a day without any adverse effects.  Continue medication from pharmacoprophylaxis.      Objective   Physical Exam:  Blood pressure 132/82, pulse 100, temperature 98 F (36.7 C), height 5'  6" (1.676 m), weight (!) 330 lb (149.7 kg), SpO2 100%. Body mass index is 53.26 kg/m.  General: She is overweight, cooperative, alert, well developed, and in no acute distress. PSYCH: Has normal mood, affect and thought process.   HEENT: EOMI, sclerae are anicteric. Lungs: Normal breathing effort, no conversational dyspnea. Extremities: No edema.  Neurologic: No gross sensory or motor deficits. No tremors or fasciculations noted.    Diagnostic Data Reviewed:  BMET    Component Value Date/Time   NA 139 07/10/2023 0929   K 4.8 07/10/2023 0929   CL 102 07/10/2023 0929   CO2 23 07/10/2023 0929   GLUCOSE 98 07/10/2023 0929   BUN 12 07/10/2023 0929   CREATININE 0.91 07/10/2023 0929   CALCIUM 9.7 07/10/2023 0929   Lab Results  Component Value Date   HGBA1C 6.4 (H) 07/10/2023   Lab Results  Component Value Date   INSULIN 30.8 (H) 07/10/2023   Lab Results  Component Value Date   TSH 1.210 07/10/2023   CBC    Component Value Date/Time   WBC 4.7 07/10/2023 0929   RBC 4.88 07/10/2023 0929   HGB 14.6 07/10/2023 0929   HCT 44.0 07/10/2023 0929   PLT 280 07/10/2023 0929   MCV 90 07/10/2023 0929  MCH 29.9 07/10/2023 0929   MCHC 33.2 07/10/2023 0929   RDW 12.7 07/10/2023 0929   Iron Studies    Component Value Date/Time   IRON 110 07/24/2023 1441   TIBC 345 07/24/2023 1441   FERRITIN 31 07/24/2023 1441   IRONPCTSAT 32 07/24/2023 1441   Lipid Panel     Component Value Date/Time   CHOL 229 (H) 07/10/2023 0929   TRIG 142 07/10/2023 0929   HDL 49 07/10/2023 0929   LDLCALC 154 (H) 07/10/2023 0929   Hepatic Function Panel     Component Value Date/Time   PROT 6.9 07/10/2023 0929   ALBUMIN 4.1 07/10/2023 0929   AST 32 07/10/2023 0929   ALT 43 (H) 07/10/2023 0929   ALKPHOS 100 07/10/2023 0929   BILITOT 0.2 07/10/2023 0929      Component Value Date/Time   TSH 1.210 07/10/2023 0929   Nutritional Lab Results  Component Value Date   VD25OH 16.4 (L) 07/10/2023     Follow-Up   Return in about 3 weeks (around 10/22/2023) for For Weight Mangement with Dr. Rikki Spearing.Marland Kitchen She was informed of the importance of frequent follow up visits to maximize her success with intensive lifestyle modifications for her multiple health conditions.  Attestation Statement   Reviewed by clinician on day of visit: allergies, medications, problem list, medical history, surgical history, family history, social history, and previous encounter notes.     Worthy Rancher, MD

## 2023-10-01 NOTE — Assessment & Plan Note (Signed)
 Most recent A1c is  Lab Results  Component Value Date   HGBA1C 6.4 (H) 07/10/2023    Patient aware of disease state and risk of progression. This may contribute to abnormal cravings, fatigue and diabetic complications without having diabetes.   Currently on metformin XR 500 mg twice a day without any adverse effects.  Continue medication from pharmacoprophylaxis.

## 2023-10-01 NOTE — Assessment & Plan Note (Signed)
-   Refer to obesity management plan - Continue with current weight management strategy. - Discussed adjustments to overcome identified barriers. - Reinforce dietary and exercise goals. -Check cost of Zepbound pharmacy using savings card if cost.  If start Qsymia for appetite suppression.

## 2023-10-02 DIAGNOSIS — F4310 Post-traumatic stress disorder, unspecified: Secondary | ICD-10-CM | POA: Diagnosis not present

## 2023-10-08 DIAGNOSIS — F4310 Post-traumatic stress disorder, unspecified: Secondary | ICD-10-CM | POA: Diagnosis not present

## 2023-10-15 DIAGNOSIS — F4310 Post-traumatic stress disorder, unspecified: Secondary | ICD-10-CM | POA: Diagnosis not present

## 2023-10-22 DIAGNOSIS — F4310 Post-traumatic stress disorder, unspecified: Secondary | ICD-10-CM | POA: Diagnosis not present

## 2023-10-29 DIAGNOSIS — F4310 Post-traumatic stress disorder, unspecified: Secondary | ICD-10-CM | POA: Diagnosis not present

## 2023-11-01 ENCOUNTER — Ambulatory Visit (INDEPENDENT_AMBULATORY_CARE_PROVIDER_SITE_OTHER): Payer: 59 | Admitting: Internal Medicine

## 2023-11-05 ENCOUNTER — Encounter (INDEPENDENT_AMBULATORY_CARE_PROVIDER_SITE_OTHER): Payer: Self-pay

## 2023-11-05 DIAGNOSIS — F4310 Post-traumatic stress disorder, unspecified: Secondary | ICD-10-CM | POA: Diagnosis not present

## 2023-11-12 DIAGNOSIS — F4310 Post-traumatic stress disorder, unspecified: Secondary | ICD-10-CM | POA: Diagnosis not present

## 2023-11-19 DIAGNOSIS — F4310 Post-traumatic stress disorder, unspecified: Secondary | ICD-10-CM | POA: Diagnosis not present

## 2023-11-20 ENCOUNTER — Other Ambulatory Visit (HOSPITAL_COMMUNITY)
Admission: RE | Admit: 2023-11-20 | Discharge: 2023-11-20 | Disposition: A | Discharge status: Home / Self Care | Payer: Self-pay | Source: Ambulatory Visit | Attending: Medical Genetics | Admitting: Medical Genetics

## 2023-11-26 DIAGNOSIS — F4310 Post-traumatic stress disorder, unspecified: Secondary | ICD-10-CM | POA: Diagnosis not present

## 2023-12-02 LAB — GENECONNECT MOLECULAR SCREEN: Genetic Analysis Overall Interpretation: NEGATIVE

## 2023-12-03 DIAGNOSIS — F4310 Post-traumatic stress disorder, unspecified: Secondary | ICD-10-CM | POA: Diagnosis not present

## 2023-12-10 DIAGNOSIS — F4310 Post-traumatic stress disorder, unspecified: Secondary | ICD-10-CM | POA: Diagnosis not present

## 2023-12-17 DIAGNOSIS — F4310 Post-traumatic stress disorder, unspecified: Secondary | ICD-10-CM | POA: Diagnosis not present

## 2023-12-27 DIAGNOSIS — G4733 Obstructive sleep apnea (adult) (pediatric): Secondary | ICD-10-CM | POA: Diagnosis not present

## 2023-12-31 DIAGNOSIS — F4310 Post-traumatic stress disorder, unspecified: Secondary | ICD-10-CM | POA: Diagnosis not present

## 2024-01-14 DIAGNOSIS — F4310 Post-traumatic stress disorder, unspecified: Secondary | ICD-10-CM | POA: Diagnosis not present

## 2024-01-21 DIAGNOSIS — F4310 Post-traumatic stress disorder, unspecified: Secondary | ICD-10-CM | POA: Diagnosis not present

## 2024-01-28 DIAGNOSIS — F4310 Post-traumatic stress disorder, unspecified: Secondary | ICD-10-CM | POA: Diagnosis not present

## 2024-02-04 DIAGNOSIS — F4310 Post-traumatic stress disorder, unspecified: Secondary | ICD-10-CM | POA: Diagnosis not present

## 2024-02-11 DIAGNOSIS — F4310 Post-traumatic stress disorder, unspecified: Secondary | ICD-10-CM | POA: Diagnosis not present

## 2024-02-25 DIAGNOSIS — F4310 Post-traumatic stress disorder, unspecified: Secondary | ICD-10-CM | POA: Diagnosis not present

## 2024-03-03 DIAGNOSIS — F4310 Post-traumatic stress disorder, unspecified: Secondary | ICD-10-CM | POA: Diagnosis not present

## 2024-03-17 DIAGNOSIS — F4310 Post-traumatic stress disorder, unspecified: Secondary | ICD-10-CM | POA: Diagnosis not present

## 2024-03-24 DIAGNOSIS — F4310 Post-traumatic stress disorder, unspecified: Secondary | ICD-10-CM | POA: Diagnosis not present

## 2024-03-31 DIAGNOSIS — F4310 Post-traumatic stress disorder, unspecified: Secondary | ICD-10-CM | POA: Diagnosis not present

## 2024-04-07 DIAGNOSIS — F4310 Post-traumatic stress disorder, unspecified: Secondary | ICD-10-CM | POA: Diagnosis not present

## 2024-04-21 DIAGNOSIS — F4310 Post-traumatic stress disorder, unspecified: Secondary | ICD-10-CM | POA: Diagnosis not present

## 2024-05-05 DIAGNOSIS — F4310 Post-traumatic stress disorder, unspecified: Secondary | ICD-10-CM | POA: Diagnosis not present

## 2024-05-19 DIAGNOSIS — F4310 Post-traumatic stress disorder, unspecified: Secondary | ICD-10-CM | POA: Diagnosis not present

## 2024-05-26 DIAGNOSIS — F4310 Post-traumatic stress disorder, unspecified: Secondary | ICD-10-CM | POA: Diagnosis not present

## 2024-06-09 DIAGNOSIS — F4310 Post-traumatic stress disorder, unspecified: Secondary | ICD-10-CM | POA: Diagnosis not present

## 2024-06-16 DIAGNOSIS — F4310 Post-traumatic stress disorder, unspecified: Secondary | ICD-10-CM | POA: Diagnosis not present

## 2024-06-23 DIAGNOSIS — F4310 Post-traumatic stress disorder, unspecified: Secondary | ICD-10-CM | POA: Diagnosis not present

## 2024-07-07 DIAGNOSIS — F4310 Post-traumatic stress disorder, unspecified: Secondary | ICD-10-CM | POA: Diagnosis not present

## 2024-07-14 DIAGNOSIS — Z113 Encounter for screening for infections with a predominantly sexual mode of transmission: Secondary | ICD-10-CM | POA: Diagnosis not present

## 2024-07-21 DIAGNOSIS — F4310 Post-traumatic stress disorder, unspecified: Secondary | ICD-10-CM | POA: Diagnosis not present

## 2024-07-28 DIAGNOSIS — F4310 Post-traumatic stress disorder, unspecified: Secondary | ICD-10-CM | POA: Diagnosis not present
